# Patient Record
Sex: Female | Born: 1995 | Race: White | Hispanic: No | Marital: Married | Smoking: Never smoker
Health system: Southern US, Community
[De-identification: ages and names within clinical notes are randomized; demographics above are authoritative.]

## PROBLEM LIST (undated history)

## (undated) DIAGNOSIS — E282 Polycystic ovarian syndrome: Secondary | ICD-10-CM

## (undated) DIAGNOSIS — O24419 Gestational diabetes mellitus in pregnancy, unspecified control: Secondary | ICD-10-CM

## (undated) DIAGNOSIS — G43909 Migraine, unspecified, not intractable, without status migrainosus: Secondary | ICD-10-CM

## (undated) HISTORY — DX: Polycystic ovarian syndrome: E28.2

## (undated) HISTORY — PX: WISDOM TOOTH EXTRACTION: SHX21

## (undated) HISTORY — PX: CHOLECYSTECTOMY, LAPAROSCOPIC: SHX56

---
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
## 2016-01-02 ENCOUNTER — Encounter: Payer: Self-pay | Admitting: *Deleted

## 2016-01-02 LAB — TB SKIN TEST
Induration: 0 mm
TB Skin Test: NEGATIVE

## 2016-02-11 ENCOUNTER — Ambulatory Visit (INDEPENDENT_AMBULATORY_CARE_PROVIDER_SITE_OTHER): Payer: BLUE CROSS/BLUE SHIELD | Admitting: *Deleted

## 2016-02-11 DIAGNOSIS — Z111 Encounter for screening for respiratory tuberculosis: Secondary | ICD-10-CM

## 2016-02-11 DIAGNOSIS — Z23 Encounter for immunization: Secondary | ICD-10-CM | POA: Diagnosis not present

## 2016-02-13 LAB — TB SKIN TEST
Induration: 0 mm
TB Skin Test: NEGATIVE

## 2016-03-05 IMAGING — CR DG CHEST 2V
2 series · 2 of 2 positions shown · non-contrast
Comparison: None.

CLINICAL DATA: Chest and back pain radiating into left upper
extremity. Pleuritic.

EXAM:
CHEST  2 VIEW

[chest pa]
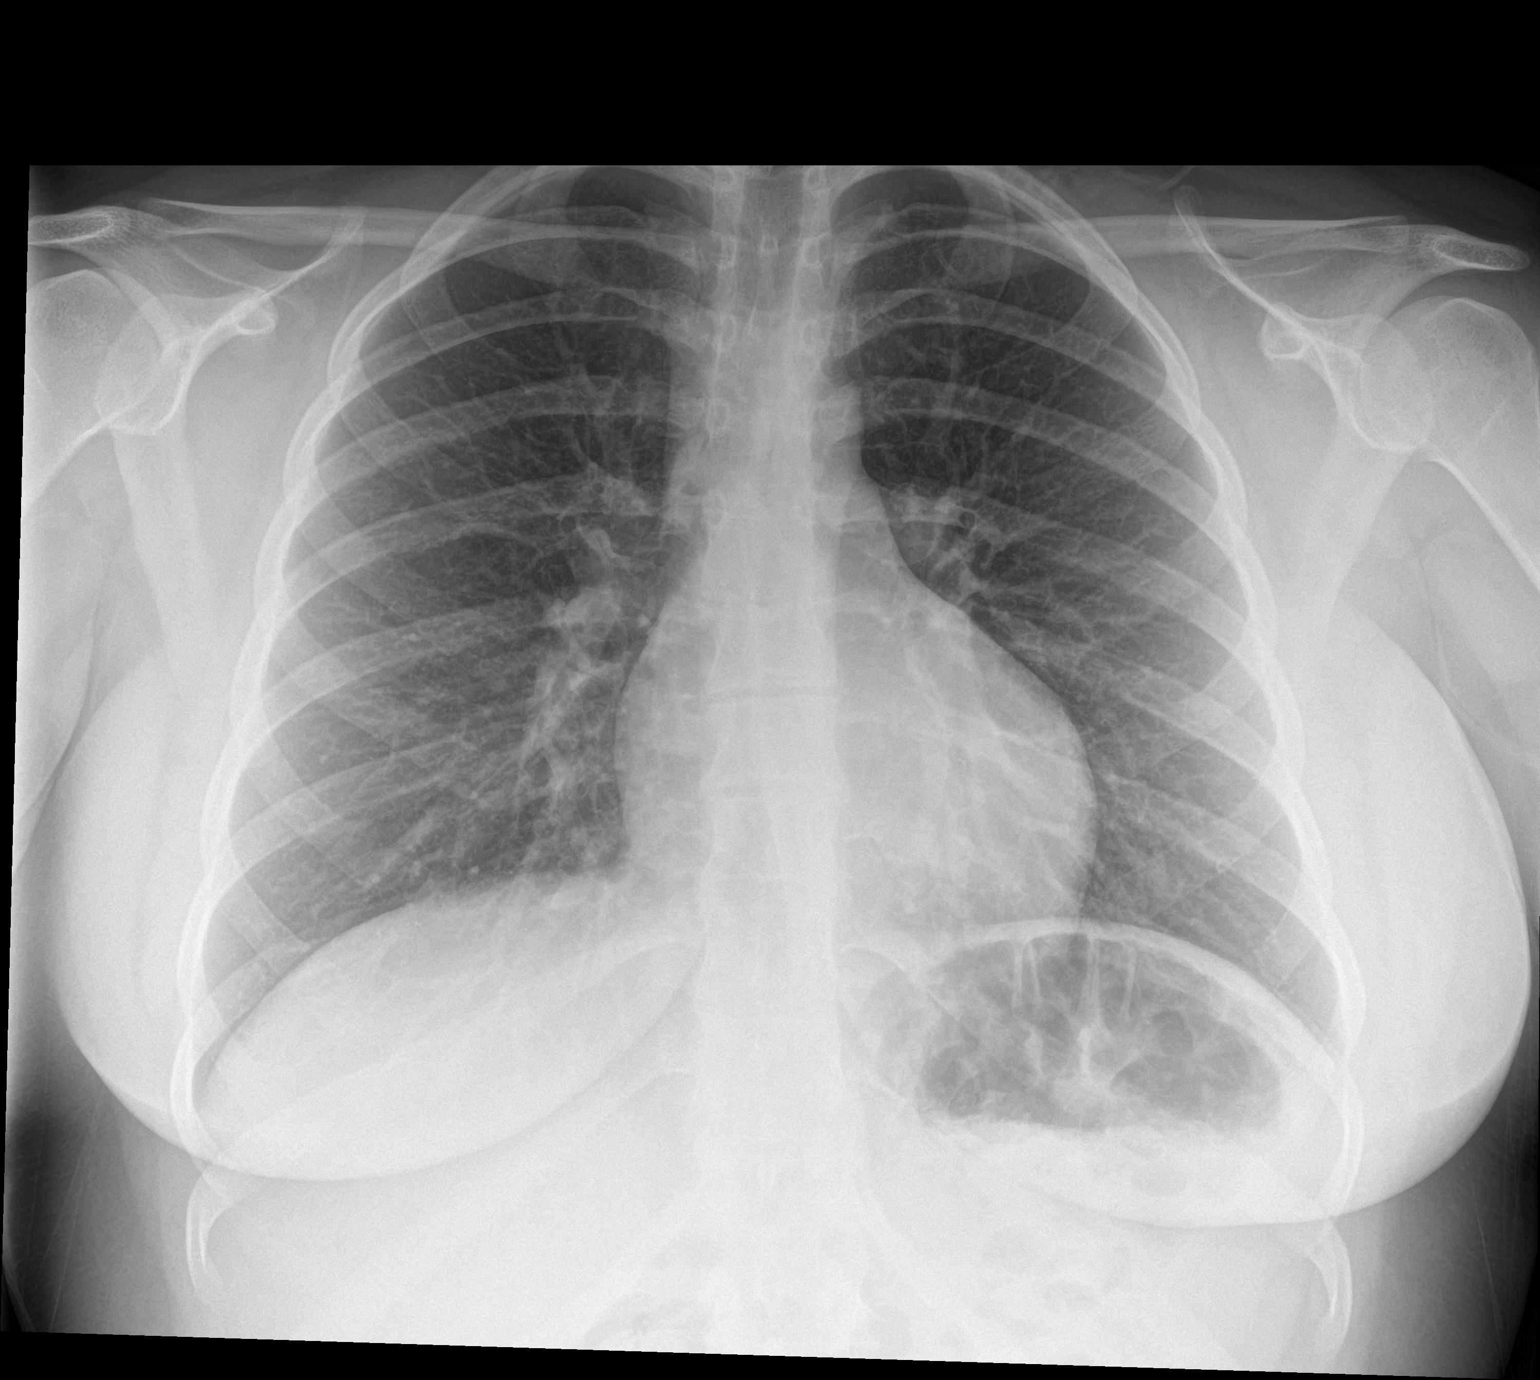

[chest lat]
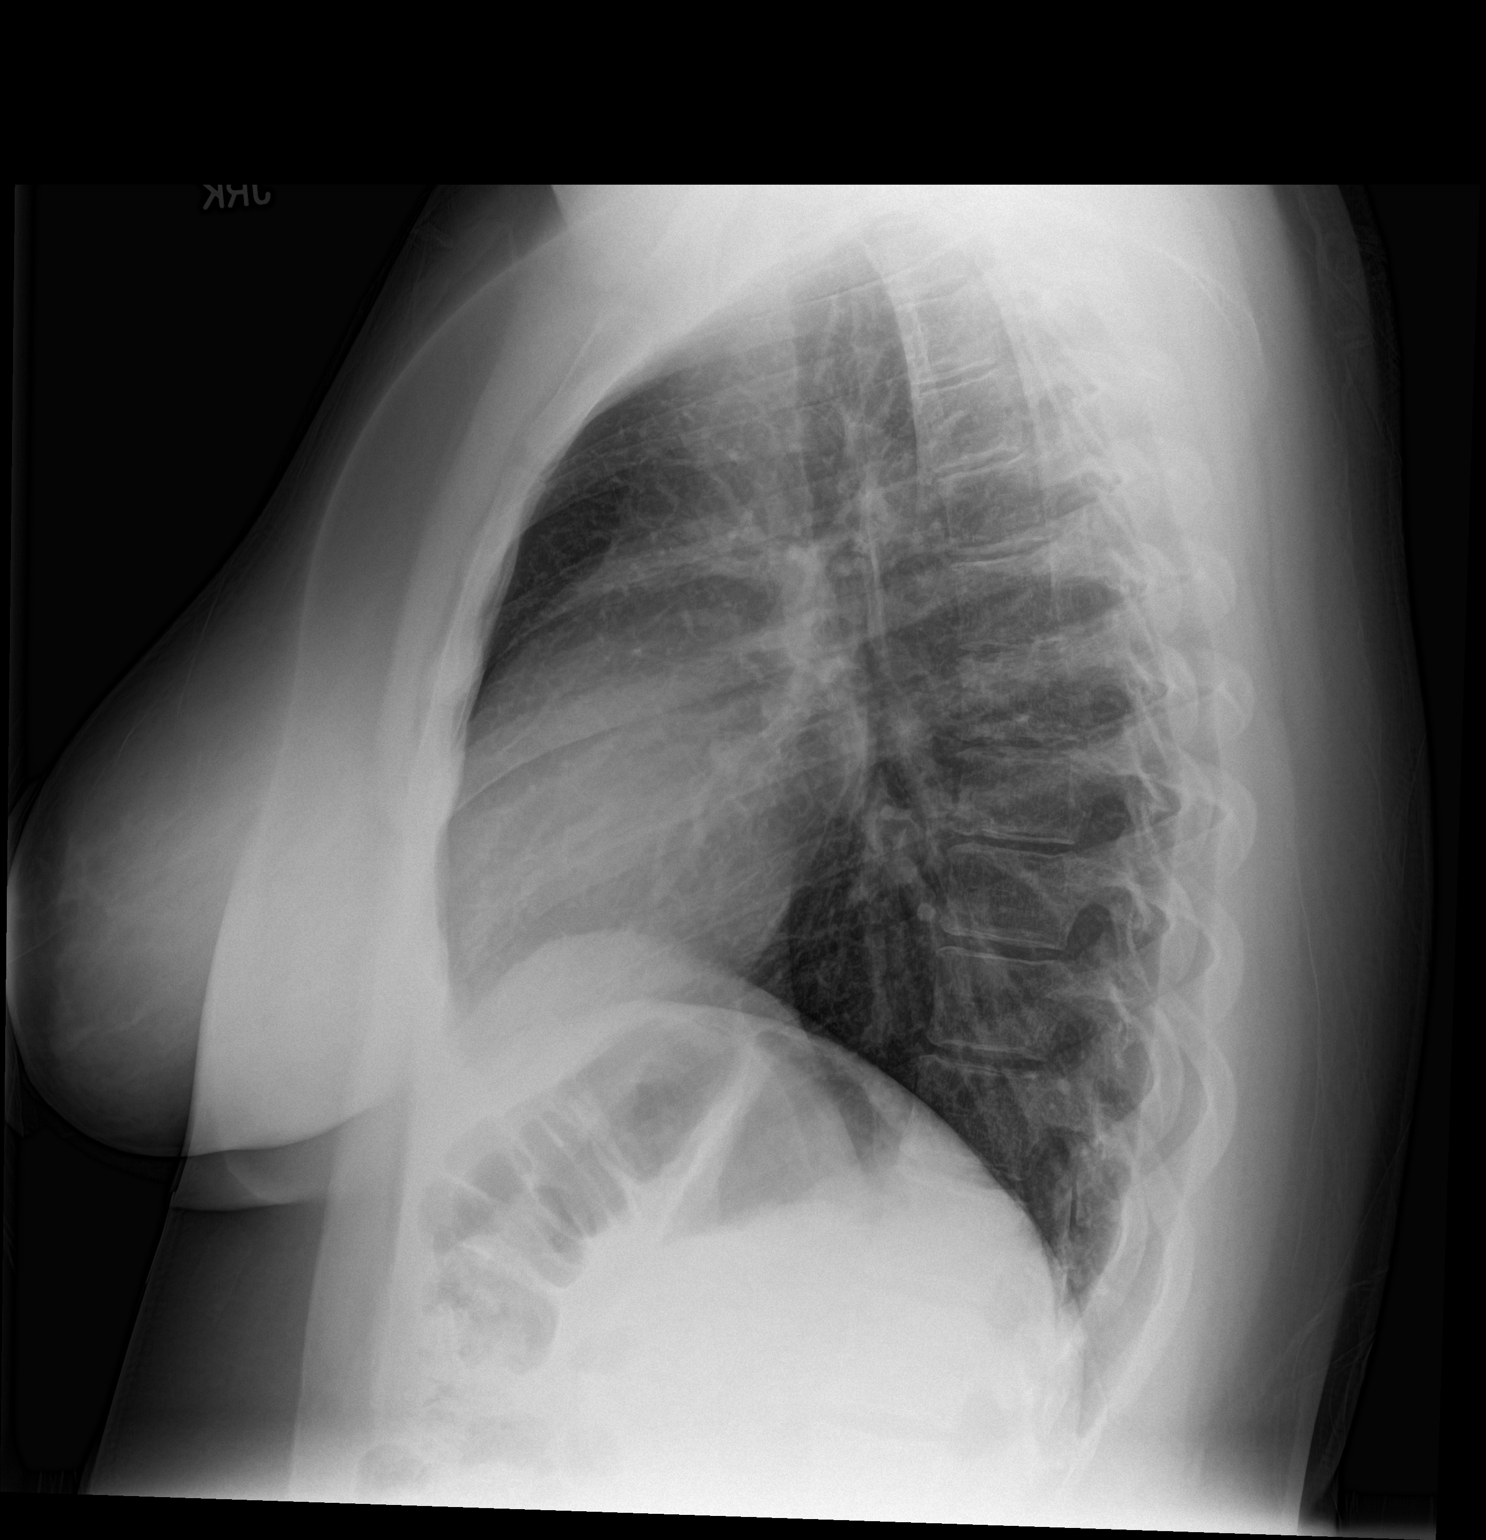

[2 of 2 positions shown; findings below may reference images not displayed]

FINDINGS: The heart size and mediastinal contours are within normal limits.
Both lungs are clear. The visualized skeletal structures are
unremarkable.
IMPRESSION: No active cardiopulmonary disease.

## 2016-03-23 ENCOUNTER — Telehealth: Payer: Self-pay | Admitting: Primary Care

## 2016-03-23 NOTE — Telephone Encounter (Signed)
[facility] - Client TELEPHONE ADVICE RECORD [facility]  Patient Name: Shelly Carpenter  DOB: 02-13-1996    Initial Comment Caller states c/o extreme lower back pain, right side flank pain, difficulty walking.   Nurse Assessment  Nurse: Dorthula RuePatten, RN, Enrique SackKendra Date/Time (Eastern Time): 03/23/2016 9:51:34 AM  Confirm and document reason for call. If symptomatic, describe symptoms. You must click the next button to save text entered. ---Caller does not have a history of back pain. She is having back pain and flank pain on the right side. Denies any urinary symptoms. Denies any fever. Caller states her pain started at 3am and woke her up.  Has the patient traveled out of the country within the last 30 days? ---Not Applicable  Does the patient have any new or worsening symptoms? ---Yes  Will a triage be completed? ---Yes  Related visit to physician within the last 2 weeks? ---No  Does the PT have any chronic conditions? (i.e. diabetes, asthma, etc.) ---Yes  List chronic conditions. ---PCOS  Is the patient pregnant or possibly pregnant? (Ask all females between the ages of 1212-55) ---No  Is this a behavioral health or substance abuse call? ---No     Guidelines    Guideline Title Affirmed Question Affirmed Notes  Flank Pain [1] SEVERE pain (e.g., excruciating, scale 8-10) AND [2] present > 1 hour    Final Disposition User   Go to ED Now Dorthula RuePatten, RN, Enrique SackKendra    Referrals  [facility] - ED   Disagree/Comply: Comply

## 2016-03-23 NOTE — Telephone Encounter (Signed)
Per DPR, left detail message left for patient to return my call. 

## 2016-03-23 NOTE — Telephone Encounter (Signed)
Please call and check on patient.  Is she still experiencing her symptoms? If so, then what? Please schedule her for an office visit this week if symptoms are still persistent.

## 2016-06-08 ENCOUNTER — Other Ambulatory Visit: Payer: Self-pay | Admitting: Gastroenterology

## 2016-06-08 DIAGNOSIS — R1011 Right upper quadrant pain: Secondary | ICD-10-CM

## 2016-06-15 ENCOUNTER — Ambulatory Visit
Admission: RE | Admit: 2016-06-15 | Discharge: 2016-06-15 | Disposition: A | Discharge status: Home / Self Care | Payer: BLUE CROSS/BLUE SHIELD | Source: Ambulatory Visit | Attending: Gastroenterology | Admitting: Gastroenterology

## 2016-06-15 DIAGNOSIS — R1011 Right upper quadrant pain: Secondary | ICD-10-CM

## 2016-06-18 ENCOUNTER — Ambulatory Visit (INDEPENDENT_AMBULATORY_CARE_PROVIDER_SITE_OTHER): Payer: BLUE CROSS/BLUE SHIELD | Admitting: Family Medicine

## 2016-06-18 ENCOUNTER — Encounter: Payer: Self-pay | Admitting: Family Medicine

## 2016-06-18 VITALS — BP 100/60 | HR 77 | Temp 98.0°F | Ht 63.0 in | Wt 223.2 lb

## 2016-06-18 DIAGNOSIS — N390 Urinary tract infection, site not specified: Secondary | ICD-10-CM | POA: Insufficient documentation

## 2016-06-18 DIAGNOSIS — N3001 Acute cystitis with hematuria: Secondary | ICD-10-CM

## 2016-06-18 DIAGNOSIS — R300 Dysuria: Secondary | ICD-10-CM

## 2016-06-18 LAB — POC URINALSYSI DIPSTICK (AUTOMATED)
Blood, UA: NEGATIVE
Glucose, UA: NEGATIVE
Ketones, UA: NEGATIVE
Nitrite, UA: POSITIVE
Spec Grav, UA: >=1.03
Urobilinogen, UA: 1
pH, UA: 6

## 2016-06-18 LAB — POCT UA - MICROSCOPIC ONLY: Yeast, UA: NEGATIVE

## 2016-06-18 MED ORDER — SULFAMETHOXAZOLE-TRIMETHOPRIM 800-160 MG PO TABS: 1.0000 | ORAL_TABLET | Freq: Two times a day (BID) | ORAL | 0 refills | Status: DC

## 2016-06-18 NOTE — Addendum Note (Signed)
Addended by: Damita LackLORING, DONNA S on: 06/18/2016 04:48 PM   Modules accepted: Orders

## 2016-06-18 NOTE — Patient Instructions (Signed)
Push fluids, water intake.  We will start 3 day course of antibiotics.  We will call with urine culture.  If symptoms not improving at end of course, call.   Urinary Tract Infection, Adult A urinary tract infection (UTI) is an infection of any part of the urinary tract, which includes the kidneys, ureters, bladder, and urethra. These organs make, store, and get rid of urine in the body. UTI can be a bladder infection (cystitis) or kidney infection (pyelonephritis). What are the causes? This infection may be caused by fungi, viruses, or bacteria. Bacteria are the most common cause of UTIs. This condition can also be caused by repeated incomplete emptying of the bladder during urination. What increases the risk? This condition is more likely to develop if:  You ignore your need to urinate or hold urine for long periods of time.  You do not empty your bladder completely during urination.  You wipe back to front after urinating or having a bowel movement, if you are female.  You are uncircumcised, if you are female.  You are constipated.  You have a urinary catheter that stays in place (indwelling).  You have a weak defense (immune) system.  You have a medical condition that affects your bowels, kidneys, or bladder.  You have diabetes.  You take antibiotic medicines frequently or for long periods of time, and the antibiotics no longer work well against certain types of infections (antibiotic resistance).  You take medicines that irritate your urinary tract.  You are exposed to chemicals that irritate your urinary tract.  You are female. What are the signs or symptoms? Symptoms of this condition include:  Fever.  Frequent urination or passing small amounts of urine frequently.  Needing to urinate urgently.  Pain or burning with urination.  Urine that smells bad or unusual.  Cloudy urine.  Pain in the lower abdomen or back.  Trouble urinating.  Blood in the  urine.  Vomiting or being less hungry than normal.  Diarrhea or abdominal pain.  Vaginal discharge, if you are female. How is this diagnosed? This condition is diagnosed with a medical history and physical exam. You will also need to provide a urine sample to test your urine. Other tests may be done, including:  Blood tests.  Sexually transmitted disease (STD) testing. If you have had more than one UTI, a cystoscopy or imaging studies may be done to determine the cause of the infections. How is this treated? Treatment for this condition often includes a combination of two or more of the following:  Antibiotic medicine.  Other medicines to treat less common causes of UTI.  Over-the-counter medicines to treat pain.  Drinking enough water to stay hydrated. Follow these instructions at home:  Take over-the-counter and prescription medicines only as told by your health care provider.  If you were prescribed an antibiotic, take it as told by your health care provider. Do not stop taking the antibiotic even if you start to feel better.  Avoid alcohol, caffeine, tea, and carbonated beverages. They can irritate your bladder.  Drink enough fluid to keep your urine clear or pale yellow.  Keep all follow-up visits as told by your health care provider. This is important.  Make sure to:  Empty your bladder often and completely. Do not hold urine for long periods of time.  Empty your bladder before and after sex.  Wipe from front to back after a bowel movement if you are female. Use each tissue one time when you  wipe. Contact a health care provider if:  You have back pain.  You have a fever.  You feel nauseous or vomit.  Your symptoms do not get better after 3 days.  Your symptoms go away and then return. Get help right away if:  You have severe back pain or lower abdominal pain.  You are vomiting and cannot keep down any medicines or water. This information is not  intended to replace advice given to you by your health care provider. Make sure you discuss any questions you have with your health care provider. Document Released: 01/28/2005 Document Revised: 10/02/2015 Document Reviewed: 03/11/2015 Elsevier Interactive Patient Education  2017 [employer].

## 2016-06-18 NOTE — Progress Notes (Signed)
   Subjective:    Patient ID: Shelly Carpenter, female    DOB: [birth year], 21 y.o.   MRN: [id number]  Dysuria   This is a new problem. The current episode started yesterday. The problem has been gradually improving. The quality of the pain is described as stabbing. The pain is at a severity of 6/10. The pain is moderate. There has been no fever. She is sexually active. There is no history of pyelonephritis. Associated symptoms include frequency, hematuria, nausea and urgency. Pertinent negatives include no chills, discharge or vomiting. She has tried increased fluids ( cranberry juice, azo) for the symptoms. The treatment provided mild relief. There is no history of catheterization, kidney stones, recurrent UTIs, a single kidney, urinary stasis or a urological procedure.  Hematuria  Irritative symptoms include frequency and urgency. Associated symptoms include dysuria and nausea. Pertinent negatives include no chills or vomiting. There is no history of kidney stones.      Review of Systems  Constitutional: Negative for chills.  Gastrointestinal: Positive for nausea. Negative for vomiting.  Genitourinary: Positive for dysuria, frequency, hematuria and urgency.   Blood pressure 100/60, pulse 77, temperature 98 F (36.7 C), temperature source Oral, height 5\' 3"  (1.6 m), weight 223 lb 4 oz (101.3 kg).     Objective:   Physical Exam  Constitutional: Vital signs are normal. She appears well-developed and well-nourished. She is cooperative.  Non-toxic appearance. She does not appear ill. No distress.  HENT:  Head: Normocephalic.  Right Ear: Hearing, tympanic membrane, external ear and ear canal normal. Tympanic membrane is not erythematous, not retracted and not bulging.  Left Ear: Hearing, tympanic membrane, external ear and ear canal normal. Tympanic membrane is not erythematous, not retracted and not bulging.  Nose: No mucosal edema or rhinorrhea. Right sinus exhibits no maxillary sinus  tenderness and no frontal sinus tenderness. Left sinus exhibits no maxillary sinus tenderness and no frontal sinus tenderness.  Mouth/Throat: Uvula is midline, oropharynx is clear and moist and mucous membranes are normal.  Eyes: Conjunctivae, EOM and lids are normal. Pupils are equal, round, and reactive to light. Lids are everted and swept, no foreign bodies found.  Neck: Trachea normal and normal range of motion. Neck supple. Carotid bruit is not present. No thyroid mass and no thyromegaly present.  Cardiovascular: Normal rate, regular rhythm, S1 normal, S2 normal, intact distal pulses and normal pulses.  Exam reveals no gallop and no friction rub.   Murmur heard.  Systolic murmur is present with a grade of 1/6   benign sounding murmur in LUSB, likely flow murmur.  Pulmonary/Chest: Effort normal and breath sounds normal. No tachypnea. No respiratory distress. She has no decreased breath sounds. She has no wheezes. She has no rhonchi. She has no rales.  Abdominal: Soft. Normal appearance and bowel sounds are normal. There is tenderness in the suprapubic area. There is no CVA tenderness.  Neurological: She is alert.  Skin: Skin is warm, dry and intact. No rash noted.  Psychiatric: Her speech is normal and behavior is normal. Judgment and thought content normal. Her mood appears not anxious. Cognition and memory are normal. She does not exhibit a depressed mood.          Assessment & Plan:

## 2016-06-18 NOTE — Assessment & Plan Note (Signed)
No hematuria on UA today.  Uncomplicated. Treat with 3 days of antibiotics and fluids.

## 2016-06-18 NOTE — Progress Notes (Signed)
Pre visit review using our clinic review tool, if applicable. No additional management support is needed unless otherwise documented below in the visit note. 

## 2016-06-19 ENCOUNTER — Other Ambulatory Visit: Payer: BLUE CROSS/BLUE SHIELD

## 2016-06-23 ENCOUNTER — Telehealth: Payer: Self-pay | Admitting: Primary Care

## 2016-06-23 LAB — URINE CULTURE

## 2016-06-23 NOTE — Telephone Encounter (Signed)
Pt returned your call best number to call  [phone]

## 2016-06-23 NOTE — Telephone Encounter (Signed)
Urine culture results discussed with patient.  See result note on 06/18/2016 urine culture.

## 2016-12-09 ENCOUNTER — Ambulatory Visit: Payer: BLUE CROSS/BLUE SHIELD

## 2016-12-15 ENCOUNTER — Ambulatory Visit: Payer: BLUE CROSS/BLUE SHIELD

## 2017-05-28 IMAGING — US US ABDOMEN LIMITED
1 series · 14 of 25 positions shown · non-contrast
Comparison: None.

CLINICAL DATA: Right upper quadrant pain .

EXAM:
US ABDOMEN LIMITED - RIGHT UPPER QUADRANT

[Series 1: us abdomen limited · 0.25mm/px · 14 of 44 slices shown]
[im 1/44]
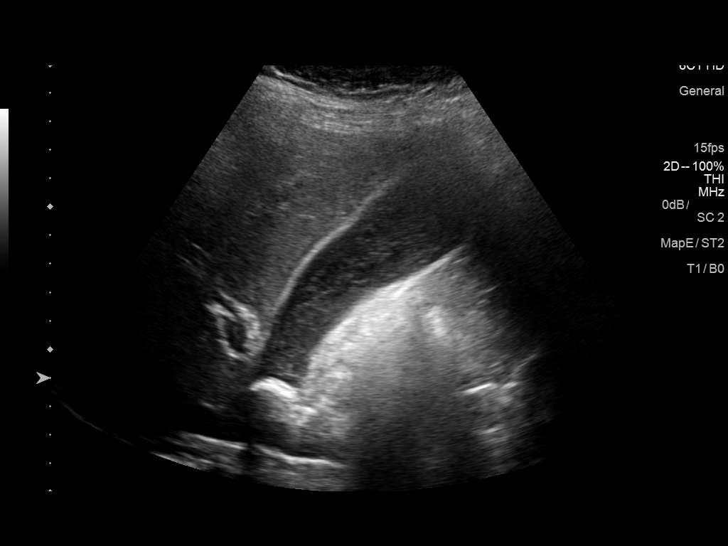
[im 4/44]
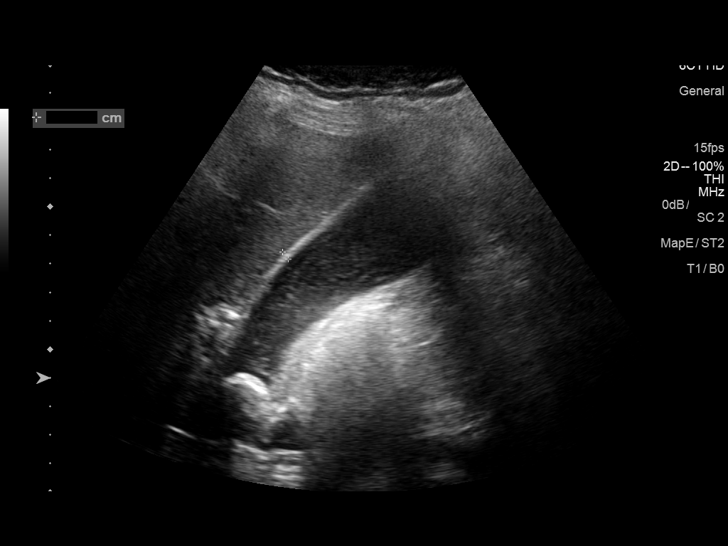
[im 8/44]
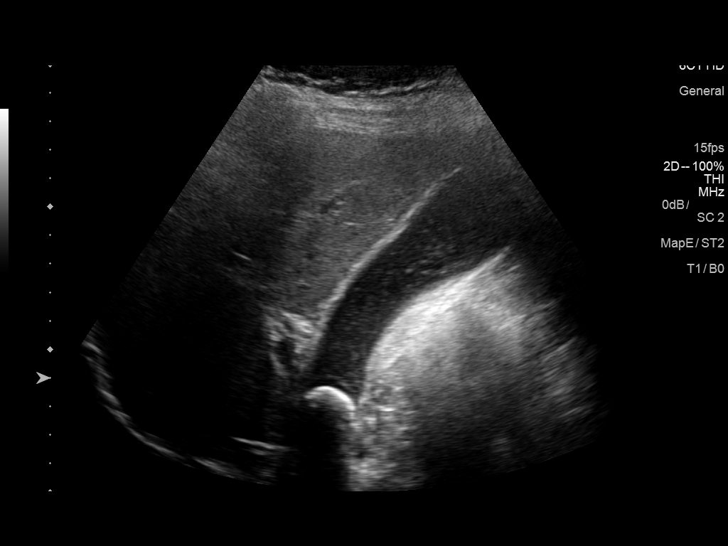
[im 11/44]
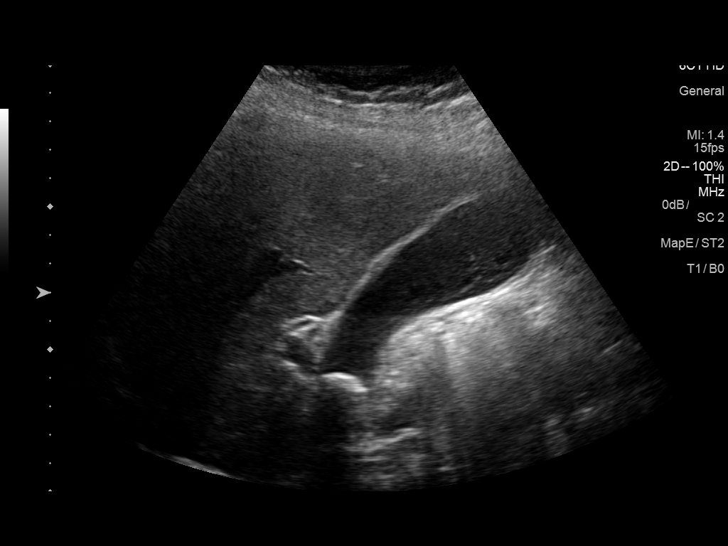
[im 15/44]
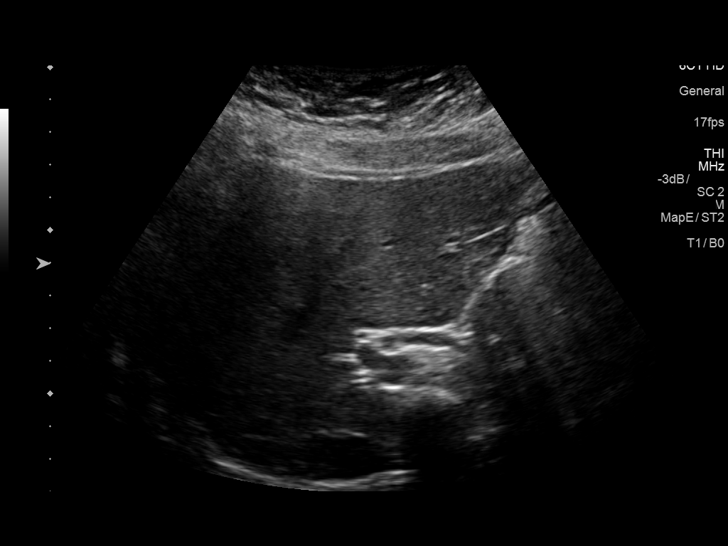
[im 17/44]
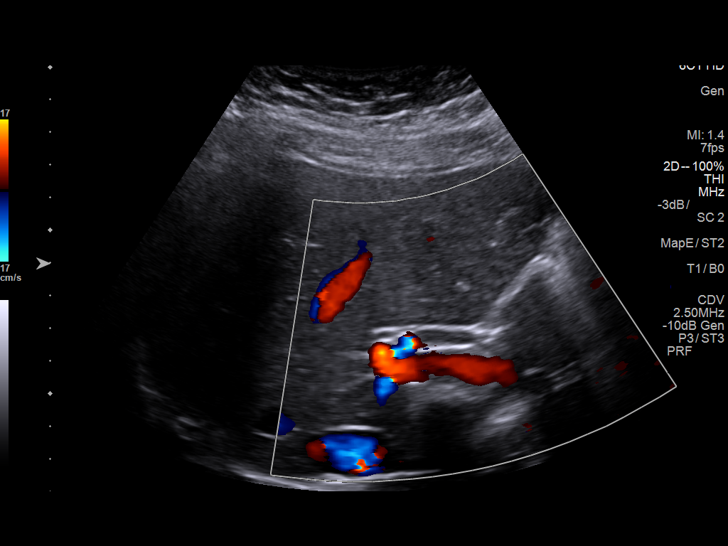
[im 20/44]
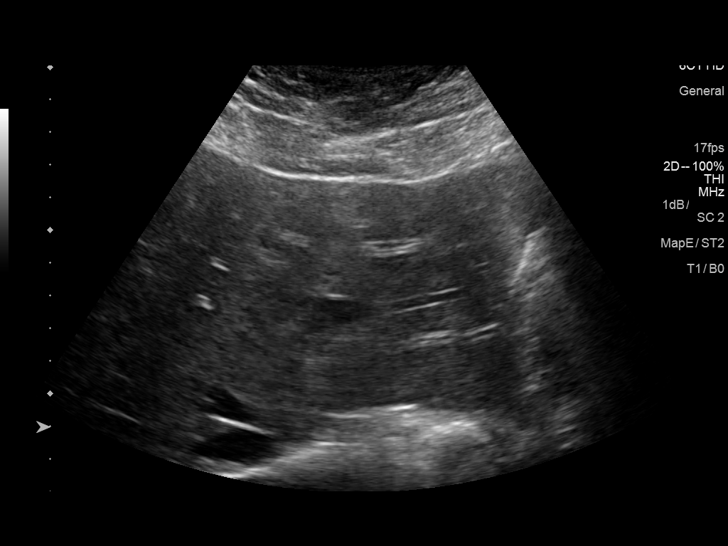
[im 24/44]
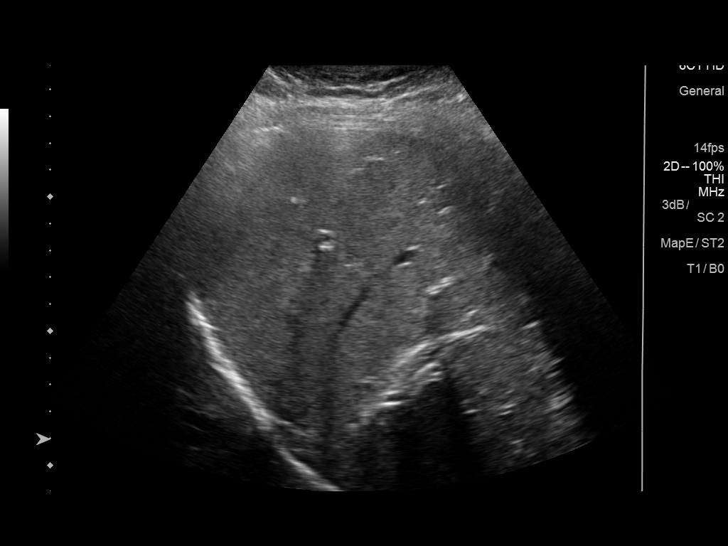
[im 27/44]
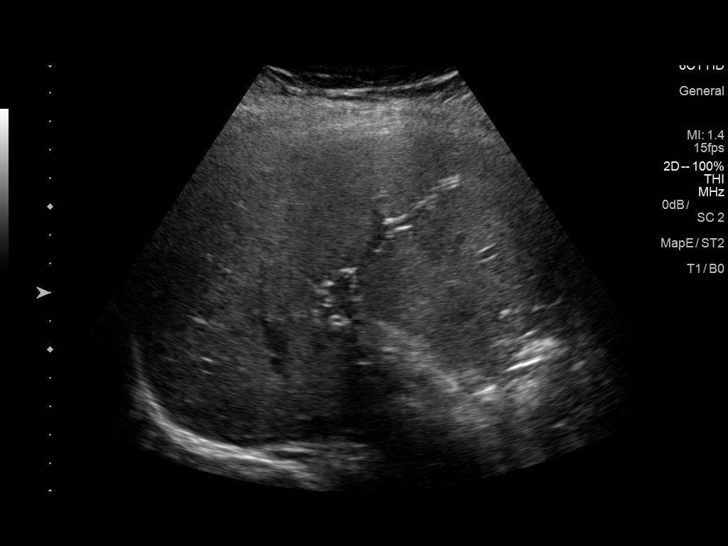
[im 29/44]
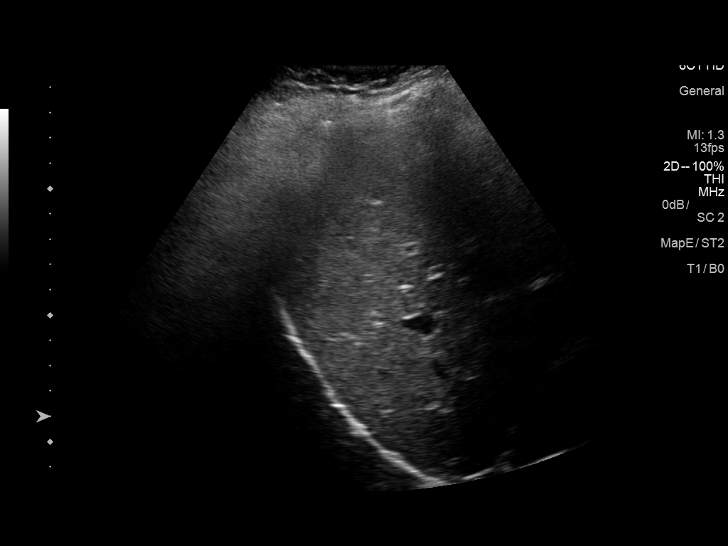
[im 33/44]
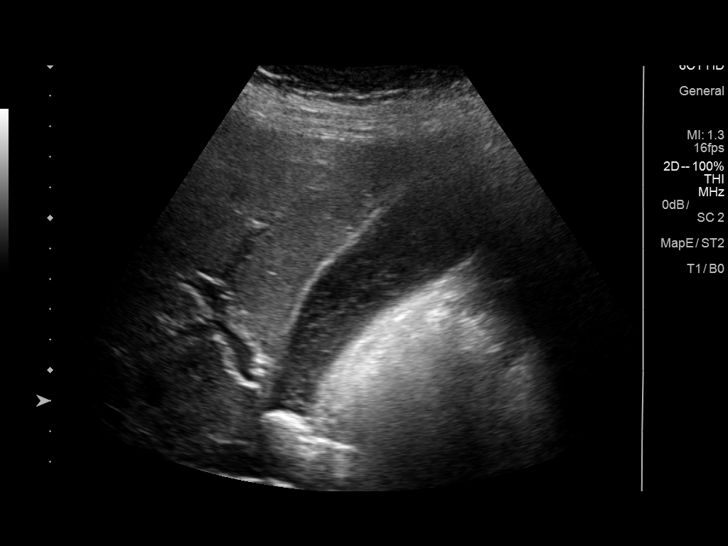
[im 36/44]
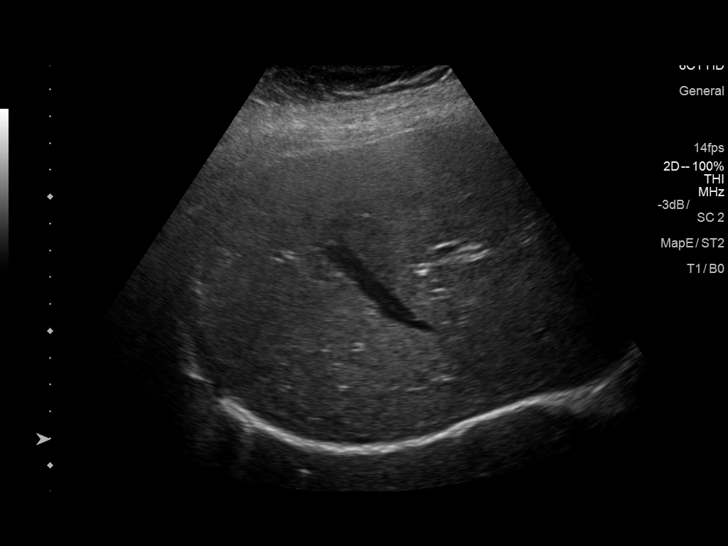
[im 40/44]
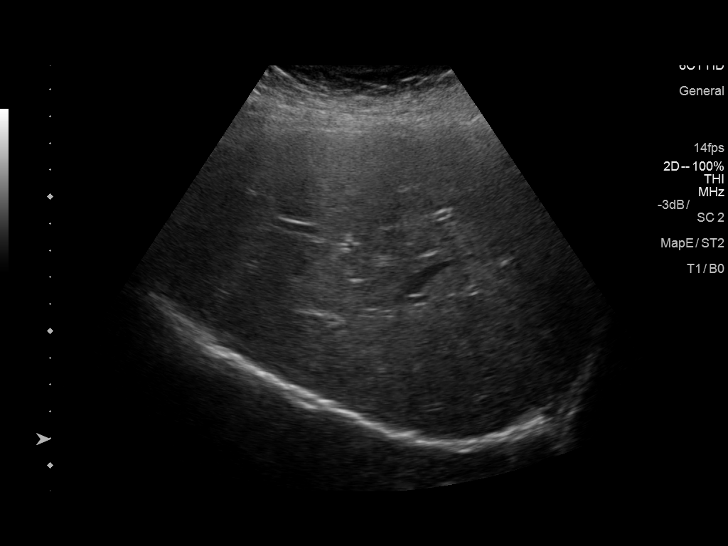
[im 44/44]
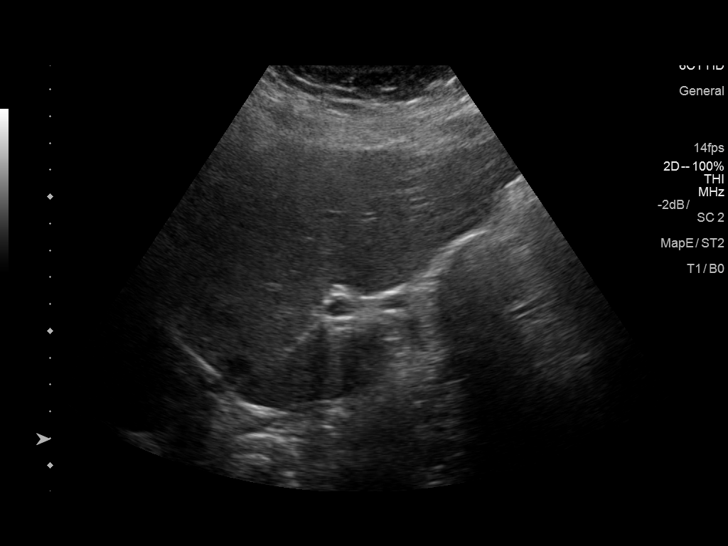

[14 of 25 positions shown; findings below may reference images not displayed]

FINDINGS: Gallbladder:

Non mobile shadowing echo densities noted in the neck of the
gallbladder measuring 2.2 cm. Is most likely a non mobile stone.
Sludge is noted gallbladder. Gallbladder wall thickness 3.2 mm.

Common bile duct:

Diameter: 3.9 mm

Liver:

No focal lesion identified. Within normal limits in parenchymal
echogenicity.
IMPRESSION: 1. Prominent gallstone in the neck of the gallbladder measuring
cm. The gallstone was non mobile. Sludge in the gall bladder.
Minimal gallbladder wall thickening at 3.2 mm. Cholecystitis cannot
be completely excluded. Negative Murphy sign. No biliary distention.

2. Liver is unremarkable.

## 2017-09-02 ENCOUNTER — Other Ambulatory Visit: Payer: Self-pay | Admitting: Obstetrics and Gynecology

## 2017-09-02 DIAGNOSIS — N644 Mastodynia: Secondary | ICD-10-CM

## 2017-09-13 ENCOUNTER — Ambulatory Visit
Admission: RE | Admit: 2017-09-13 | Discharge: 2017-09-13 | Disposition: A | Discharge status: Home / Self Care | Payer: BLUE CROSS/BLUE SHIELD | Source: Ambulatory Visit | Attending: Obstetrics and Gynecology | Admitting: Obstetrics and Gynecology

## 2017-09-13 DIAGNOSIS — N644 Mastodynia: Secondary | ICD-10-CM

## 2020-02-27 ENCOUNTER — Other Ambulatory Visit: Payer: Self-pay

## 2020-02-27 ENCOUNTER — Ambulatory Visit
Admission: EM | Admit: 2020-02-27 | Discharge: 2020-02-27 | Disposition: A | Discharge status: Home / Self Care | Payer: BLUE CROSS/BLUE SHIELD | Attending: Emergency Medicine | Admitting: Emergency Medicine

## 2020-02-27 ENCOUNTER — Encounter: Payer: Self-pay | Admitting: Emergency Medicine

## 2020-02-27 DIAGNOSIS — J069 Acute upper respiratory infection, unspecified: Secondary | ICD-10-CM | POA: Diagnosis not present

## 2020-02-27 MED ORDER — AMOXICILLIN-POT CLAVULANATE 875-125 MG PO TABS: 1.0000 | ORAL_TABLET | Freq: Two times a day (BID) | ORAL | 0 refills | Status: AC

## 2020-02-27 MED ORDER — FLUTICASONE PROPIONATE 50 MCG/ACT NA SUSP: 1.0000 | Freq: Every day | NASAL | 0 refills | Status: DC

## 2020-02-27 MED ORDER — BENZONATATE 200 MG PO CAPS: 200.0000 mg | ORAL_CAPSULE | Freq: Three times a day (TID) | ORAL | 0 refills | Status: AC | PRN

## 2020-02-27 NOTE — Discharge Instructions (Signed)
Continue to rest and drink plenty of fluids Tessalon/benzonatate every 8 hours for cough Flonase nasal spray 1 to 2 spray in each nostril daily to help with congestion May continue Mucinex May try daily cetirizine/Zyrtec to further help with congestion and drainage Ibuprofen and Tylenol as needed for headaches If not having any improvement with the above/an additional 3 to 4 days may fill prescription for Augmentin on Friday to cover sinus infection Follow-up if not improving or worsening, developing shortness of breath, wheezing or difficulty breathing

## 2020-02-27 NOTE — ED Triage Notes (Signed)
Cough, runny nose, congestion x Friday.  Pt tested by pcr for covid and was neg.

## 2020-02-27 NOTE — ED Provider Notes (Signed)
[facility]    CSN: [id number] Arrival date & time: 02/27/20  0919      History   Chief Complaint Chief Complaint  Patient presents with  . Cough    HPI Shelly Carpenter is a 24 y.o. female presenting today for evaluation of URI symptoms.  Patient reports 5 days of cough congestion and intermittent sore throat.  Using Mucinex without relief.  Reports she is concerned about her symptoms turning into bronchitis.  Symptoms are mainly in her sinuses at this point.  Denies any chest pain or shortness of breath.  Has had prior Covid testing which was negative.   HPI  Past Medical History:  Diagnosis Date  . PCOS (polycystic ovarian syndrome)   . Polycystic disease, ovaries     Patient Active Problem List   Diagnosis Date Noted  . UTI (urinary tract infection) 06/18/2016  . Preventative health care 12/27/2015  . Frequent headaches 12/27/2015  . PCOS (polycystic ovarian syndrome) 12/27/2015    Past Surgical History:  Procedure Laterality Date  . WISDOM TOOTH EXTRACTION      OB History   No obstetric history on file.      Home Medications    Prior to Admission medications   Medication Sig Start Date End Date Taking? Authorizing Provider  amoxicillin-clavulanate (AUGMENTIN) 875-125 MG tablet Take 1 tablet by mouth every 12 (twelve) hours for 7 days. 03/01/20 03/08/20  Acxel Dingee C, PA-C  benzonatate (TESSALON) 200 MG capsule Take 1 capsule (200 mg total) by mouth 3 (three) times daily as needed for up to 7 days for cough. 02/27/20 03/05/20  Ezra Denne C, PA-C  fluticasone (FLONASE) 50 MCG/ACT nasal spray Place 1-2 sprays into both nostrils daily. 02/27/20   Adilene Areola C, PA-C  metFORMIN (GLUCOPHAGE) 500 MG tablet Take 500 mg by mouth daily with breakfast.     [provider]  norelgestromin-ethinyl estradiol Burr Medico) 150-35 MCG/24HR transdermal patch Place 1 patch onto the skin once a week.    [provider]    sulfamethoxazole-trimethoprim (BACTRIM DS,SEPTRA DS) 800-160 MG tablet Take 1 tablet by mouth 2 (two) times daily. 06/18/16   Excell Seltzer, MD    Family History No family history on file.  Social History Social History   Tobacco Use  . Smoking status: Never Smoker  . Smokeless tobacco: Never Used  Substance Use Topics  . Alcohol use: No  . Drug use: Not on file     Allergies   Patient has no known allergies.   Review of Systems Review of Systems  Constitutional: Negative for activity change, appetite change, chills, fatigue and fever.  HENT: Positive for congestion, rhinorrhea, sinus pressure and sore throat. Negative for ear pain and trouble swallowing.   Eyes: Negative for discharge and redness.  Respiratory: Positive for cough. Negative for chest tightness and shortness of breath.   Cardiovascular: Negative for chest pain.  Gastrointestinal: Positive for nausea. Negative for abdominal pain, diarrhea and vomiting.  Musculoskeletal: Negative for myalgias.  Skin: Negative for rash.  Neurological: Negative for dizziness, light-headedness and headaches.     Physical Exam Triage Vital Signs ED Triage Vitals  Enc Vitals Group     BP 02/27/20 0933 138/79     Pulse Rate 02/27/20 0933 86     Resp 02/27/20 0933 19     Temp 02/27/20 0933 98.2 F (36.8 C)     Temp Source 02/27/20 0933 Oral     SpO2 02/27/20 0933 97 %  Weight 02/27/20 0934 237 lb (107.5 kg)     Height 02/27/20 0934 5\' 3"  (1.6 m)     Head Circumference --      Peak Flow --      Pain Score 02/27/20 0934 0     Pain Loc --      Pain Edu? --      Excl. in GC? --    No data found.  Updated Vital Signs BP 138/79 (BP Location: Right Arm)   Pulse 86   Temp 98.2 F (36.8 C) (Oral)   Resp 19   Ht 5\' 3"  (1.6 m)   Wt 237 lb (107.5 kg)   LMP 01/30/2020   SpO2 97%   BMI 41.98 kg/m   Visual Acuity Right Eye Distance:   Left Eye Distance:   Bilateral Distance:    Right Eye Near:   Left Eye Near:     Bilateral Near:     Physical Exam Vitals and nursing note reviewed.  Constitutional:      Appearance: She is well-developed.     Comments: No acute distress  HENT:     Head: Normocephalic and atraumatic.     Ears:     Comments: Bilateral ears without tenderness to palpation of external auricle, tragus and mastoid, EAC's without erythema or swelling, TM's with good bony landmarks and cone of light. Non erythematous.     Nose: Nose normal.     Mouth/Throat:     Comments: Oral mucosa pink and moist, no tonsillar enlargement or exudate. Posterior pharynx patent and nonerythematous, no uvula deviation or swelling. Normal phonation. Eyes:     Conjunctiva/sclera: Conjunctivae normal.  Cardiovascular:     Rate and Rhythm: Normal rate.  Pulmonary:     Effort: Pulmonary effort is normal. No respiratory distress.     Comments: Breathing comfortably at rest, CTABL, no wheezing, rales or other adventitious sounds auscultated Abdominal:     General: There is no distension.  Musculoskeletal:        General: Normal range of motion.     Cervical back: Neck supple.  Skin:    General: Skin is warm and dry.  Neurological:     Mental Status: She is alert and oriented to person, place, and time.      UC Treatments / Results  Labs (all labs ordered are listed, but only abnormal results are displayed) Labs Reviewed - No data to display  EKG   Radiology No results found.  Procedures Procedures (including critical care time)  Medications Ordered in UC Medications - No data to display  Initial Impression / Assessment and Plan / UC Course  I have reviewed the triage vital signs and the nursing notes.  Pertinent labs & imaging results that were available during my care of the patient were reviewed by me and considered in my medical decision making (see chart for details).     5 days of URI symptoms, lungs clear, exam reassuring, at this time still suspect likely viral URI and  recommending continued symptomatic and supportive care rest and fluids.  Discussed medicines to add in to further help relieve symptoms temporarily.  Provided prescription for Augmentin to fill on Friday if still not having any improvement with recommendations today to cover sinusitis.  No signs of bronchitis or pneumonia at this time.  Discussed strict return precautions. Patient verbalized understanding and is agreeable with plan.  Final Clinical Impressions(s) / UC Diagnoses   Final diagnoses:  Viral URI with cough  Discharge Instructions     Continue to rest and drink plenty of fluids Tessalon/benzonatate every 8 hours for cough Flonase nasal spray 1 to 2 spray in each nostril daily to help with congestion May continue Mucinex May try daily cetirizine/Zyrtec to further help with congestion and drainage Ibuprofen and Tylenol as needed for headaches If not having any improvement with the above/an additional 3 to 4 days may fill prescription for Augmentin on Friday to cover sinus infection Follow-up if not improving or worsening, developing shortness of breath, wheezing or difficulty breathing    ED Prescriptions    Medication Sig Dispense Auth. Provider   benzonatate (TESSALON) 200 MG capsule Take 1 capsule (200 mg total) by mouth 3 (three) times daily as needed for up to 7 days for cough. 28 capsule Chevi Lim C, PA-C   fluticasone (FLONASE) 50 MCG/ACT nasal spray Place 1-2 sprays into both nostrils daily. 16 g Ja Pistole C, PA-C   amoxicillin-clavulanate (AUGMENTIN) 875-125 MG tablet Take 1 tablet by mouth every 12 (twelve) hours for 7 days. 14 tablet Ailana Cuadrado, [place] C, PA-C     PDMP not reviewed this encounter.   Sylvan Sookdeo, [place] C, PA-C 02/27/20 1019

## 2021-05-06 ENCOUNTER — Other Ambulatory Visit: Payer: Self-pay | Admitting: Physician Assistant

## 2021-05-06 ENCOUNTER — Other Ambulatory Visit (HOSPITAL_COMMUNITY): Payer: Self-pay | Admitting: Physician Assistant

## 2021-05-06 DIAGNOSIS — G43009 Migraine without aura, not intractable, without status migrainosus: Secondary | ICD-10-CM

## 2021-05-06 DIAGNOSIS — H9319 Tinnitus, unspecified ear: Secondary | ICD-10-CM

## 2021-05-14 ENCOUNTER — Ambulatory Visit (HOSPITAL_COMMUNITY)
Admission: RE | Admit: 2021-05-14 | Discharge: 2021-05-14 | Disposition: A | Discharge status: Home / Self Care | Payer: BC Managed Care – PPO | Source: Ambulatory Visit | Attending: Physician Assistant | Admitting: Physician Assistant

## 2021-05-14 ENCOUNTER — Other Ambulatory Visit: Payer: Self-pay

## 2021-05-14 DIAGNOSIS — H9319 Tinnitus, unspecified ear: Secondary | ICD-10-CM | POA: Diagnosis present

## 2021-05-14 DIAGNOSIS — G43009 Migraine without aura, not intractable, without status migrainosus: Secondary | ICD-10-CM | POA: Insufficient documentation

## 2021-05-20 ENCOUNTER — Encounter: Payer: Self-pay | Admitting: Neurology

## 2021-05-20 ENCOUNTER — Ambulatory Visit (INDEPENDENT_AMBULATORY_CARE_PROVIDER_SITE_OTHER): Payer: BC Managed Care – PPO | Admitting: Neurology

## 2021-05-20 ENCOUNTER — Other Ambulatory Visit: Payer: Self-pay

## 2021-05-20 VITALS — BP 127/70 | HR 81 | Ht 63.0 in | Wt 257.0 lb

## 2021-05-20 DIAGNOSIS — G43709 Chronic migraine without aura, not intractable, without status migrainosus: Secondary | ICD-10-CM | POA: Diagnosis not present

## 2021-05-20 DIAGNOSIS — E348 Other specified endocrine disorders: Secondary | ICD-10-CM | POA: Insufficient documentation

## 2021-05-20 MED ORDER — TOPIRAMATE 100 MG PO TABS: 100.0000 mg | ORAL_TABLET | Freq: Two times a day (BID) | ORAL | 11 refills | Status: DC

## 2021-05-20 NOTE — Progress Notes (Signed)
error 

## 2021-05-20 NOTE — Progress Notes (Signed)
Chief Complaint  Patient presents with   New Patient (Initial Visit)    Rm 14. Accompanied by husband. 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. Shelly Carpenter MRI, complete lesion C4 level cervical spine - migraines and tinnitus. Pt reports daily head pain or headache. States pain is located in the top of the head, back of neck, or occipital region of head.      ASSESSMENT AND PLAN  Shelly Carpenter is a 26 y.o. 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   MEDICAL HISTORY:  Shelly Carpenter is a 26 year old female, seen in request by her primary care PA Launa Grill H, for evaluation of increased frequency of headaches, tinnitus, initial evaluation was on May 20, 2021 with her husband  I reviewed and summarized the referring note.  Past medical history Polycystic ovarian syndrome,  She reported a long history of mild headaches since elementary school, holoacranial, was never treated, her headache improved after college  Since 2022, she began to have frequent headaches again, especially since December 2022, she began to have headaches 3-4 times each week, right lateralized retro-orbital occipital region pain, sometimes extending to neck,  Couple times each month, she would have severe headache, pounding, light noise sensitivity, sometimes proceeding by visual disturbance, tinnitus, nausea during  intense headache, last for hours, even longer  She has tried over-the-counter Tylenol, ibuprofen, Excedrin Migraine, with limited help, was given Nurtec sample, was very helpful, headache resolved in 2 hours.  Chart reviewed, she does have 20 pounds weight gain over the past couple years, last ophthalmology visit was more than a year ago   PHYSICAL EXAM:   Vitals:   05/20/21 0846  BP: 127/70  Pulse: 81  Weight: 257 lb (116.6 kg)  Height: 5\' 3"  (1.6 m)   Not recorded     Body mass index is 45.53 kg/m.  PHYSICAL EXAMNIATION:  Gen: NAD, conversant, well nourised, well groomed                     Cardiovascular: Regular rate rhythm, no peripheral edema, warm, nontender. Eyes: Conjunctivae clear without exudates or hemorrhage Neck: Supple, no carotid bruits. Pulmonary: Clear to auscultation bilaterally   NEUROLOGICAL EXAM:  MENTAL STATUS: Speech:    Speech is normal; fluent and spontaneous with normal comprehension.  Cognition:     Orientation to time, place and person     Normal recent and remote memory     Normal Attention span and concentration     Normal Language, naming, repeating,spontaneous speech     Fund of knowledge   CRANIAL NERVES: CN II: Visual fields are full to confrontation. Pupils are round equal and briskly reactive to light. CN III, IV, VI: extraocular movement are normal. No ptosis. CN V: Facial sensation is intact to light touch CN VII: Face is symmetric with normal eye closure  CN VIII: Hearing is normal to causal conversation. CN IX, X: Phonation is normal.  CN XI: Head turning and shoulder shrug are intact  MOTOR: There is no pronator drift of out-stretched arms. Muscle bulk and tone are normal. Muscle strength is normal.  REFLEXES: Reflexes are 2+ and symmetric at the biceps, triceps, knees, and ankles. Plantar responses are flexor.  SENSORY: Intact to light touch, pinprick and vibratory sensation are intact in fingers and  toes.  COORDINATION: There is no trunk or limb dysmetria noted.  GAIT/STANCE: Posture is normal. Gait is steady with normal steps, base, arm swing, and turning. Heel and toe walking are normal. Tandem gait is normal.  Romberg is absent.  REVIEW OF SYSTEMS:  Full 14 system review of systems performed and notable only for as above All other review of systems were negative.   ALLERGIES: No Known Allergies  HOME MEDICATIONS: Current Outpatient Medications  Medication Sig Dispense Refill   dicyclomine (BENTYL) 10 MG capsule Take 10 mg by mouth 4 (four) times daily as needed.     norelgestromin-ethinyl estradiol Marilu Favre) 150-35 MCG/24HR transdermal patch Place 1 patch onto the skin once a week.     SUMAtriptan (IMITREX) 50 MG tablet Take 1 tablet by mouth as needed.     topiramate (TOPAMAX) 100 MG tablet Take 1 tablet (100 mg total) by mouth 2 (two) times daily. 60 tablet 11   No current facility-administered medications for this visit.    PAST MEDICAL HISTORY: Past Medical History:  Diagnosis Date   PCOS (polycystic ovarian syndrome)    Polycystic disease, ovaries     PAST SURGICAL HISTORY: Past Surgical History:  Procedure Laterality Date   WISDOM TOOTH EXTRACTION      FAMILY HISTORY: History reviewed. No pertinent family history.  SOCIAL HISTORY: Social History   Socioeconomic History   Marital status: Married    Spouse name: Not on file   Number of children: Not on file   Years of education: Not on file   Highest education level: Not on file  Occupational History   Not on file  Tobacco Use   Smoking status: Never   Smokeless tobacco: Never  Substance and Sexual Activity   Alcohol use: No   Drug use: Not on file   Sexual activity: Not on file  Other Topics Concern   Not on file  Social History Narrative   Not on file   Social Determinants of Health   Financial Resource Strain: Not on file  Food Insecurity: Not on file  Transportation Needs: Not on  file  Physical Activity: Not on file  Stress: Not on file  Social Connections: Not on file  Intimate Partner Violence: Not on file      Marcial Pacas, M.D. Ph.D.  [facility] [address], [place], [place] [address] Ph: [phone] Fax: [phone]  CC:  Wanita Chamberlain, PA-C [place],  [place] [address]  Wanita Chamberlain, PA-C

## 2021-05-21 LAB — CBC WITH DIFFERENTIAL/PLATELET
Basophils Absolute: 0.1 10*3/uL (ref 0.0–0.2)
Basos: 1 %
EOS (ABSOLUTE): 0.2 10*3/uL (ref 0.0–0.4)
Eos: 2 %
Hematocrit: 38.8 % (ref 34.0–46.6)
Hemoglobin: 12.7 g/dL (ref 11.1–15.9)
Immature Grans (Abs): 0 10*3/uL (ref 0.0–0.1)
Immature Granulocytes: 0 %
Lymphocytes Absolute: 2.6 10*3/uL (ref 0.7–3.1)
Lymphs: 29 %
MCH: 28.9 pg (ref 26.6–33.0)
MCHC: 32.7 g/dL (ref 31.5–35.7)
MCV: 88 fL (ref 79–97)
Monocytes Absolute: 0.4 10*3/uL (ref 0.1–0.9)
Monocytes: 5 %
Neutrophils Absolute: 5.6 10*3/uL (ref 1.4–7.0)
Neutrophils: 63 %
Platelets: 390 10*3/uL (ref 150–450)
RBC: 4.39 x10E6/uL (ref 3.77–5.28)
RDW: 13.3 % (ref 11.7–15.4)
WBC: 8.9 10*3/uL (ref 3.4–10.8)

## 2021-05-21 LAB — COMPREHENSIVE METABOLIC PANEL
ALT: 11 IU/L (ref 0–32)
AST: 11 IU/L (ref 0–40)
Albumin/Globulin Ratio: 1.4 (ref 1.2–2.2)
Albumin: 4 g/dL (ref 3.9–5.0)
Alkaline Phosphatase: 67 IU/L (ref 44–121)
BUN/Creatinine Ratio: 16 (ref 9–23)
BUN: 11 mg/dL (ref 6–20)
Bilirubin Total: <0.2 mg/dL (ref 0.0–1.2)
CO2: 19 mmol/L — ABNORMAL LOW (ref 20–29)
Calcium: 9.5 mg/dL (ref 8.7–10.2)
Chloride: 103 mmol/L (ref 96–106)
Creatinine, Ser: 0.68 mg/dL (ref 0.57–1.00)
Globulin, Total: 2.9 g/dL (ref 1.5–4.5)
Glucose: 93 mg/dL (ref 70–99)
Potassium: 4.6 mmol/L (ref 3.5–5.2)
Sodium: 137 mmol/L (ref 134–144)
Total Protein: 6.9 g/dL (ref 6.0–8.5)
eGFR: 125 mL/min/{1.73_m2} (ref >59)

## 2021-05-21 LAB — TSH: TSH: 1.76 u[IU]/mL (ref 0.450–4.500)

## 2021-06-02 ENCOUNTER — Telehealth: Payer: Self-pay | Admitting: Neurology

## 2021-06-02 NOTE — Telephone Encounter (Signed)
Pt called stating that she had requested to switch providers due to a recommendation she was given. Pt states that she was recommended to see Dr. Lucia Gaskins for her Migraines and she would like to know if she can switch to be seen by her. Please advise.

## 2021-06-03 ENCOUNTER — Telehealth: Payer: Self-pay | Admitting: Neurology

## 2021-06-03 DIAGNOSIS — G43709 Chronic migraine without aura, not intractable, without status migrainosus: Secondary | ICD-10-CM

## 2021-06-03 DIAGNOSIS — R519 Headache, unspecified: Secondary | ICD-10-CM

## 2021-06-03 DIAGNOSIS — R9389 Abnormal findings on diagnostic imaging of other specified body structures: Secondary | ICD-10-CM

## 2021-06-03 DIAGNOSIS — E348 Other specified endocrine disorders: Secondary | ICD-10-CM

## 2021-06-03 NOTE — Addendum Note (Signed)
Addended by: Lindell Spar C on: 06/03/2021 12:23 PM   Modules accepted: Orders

## 2021-06-03 NOTE — Telephone Encounter (Signed)
Pt called in asking [place] if we could send a referral over for her to see Dr. Dione Booze, please advise.

## 2021-06-03 NOTE — Telephone Encounter (Signed)
Sent to [facility] ph # [phone] 

## 2021-06-03 NOTE — Telephone Encounter (Signed)
Last office note on 05/20/21:  Also, advised her to follow-up with ophthalmology evaluation.  ________________________________  Orders placed in Epic.

## 2021-06-03 NOTE — Telephone Encounter (Signed)
It is ok to switch 

## 2021-06-09 NOTE — Telephone Encounter (Signed)
Spoke with patient and discussed the message from Dr. Lucia Gaskins.  Patient aware Dr Terrace Arabia and Dr Lucia Gaskins ok with switch, however, per Dr Lucia Gaskins, plan to follow-up with Shanda Bumps in April and that Dr Lucia Gaskins agrees with Dr Zannie Cove assessment and plan.  Patient on topiramate, Dr. Lucia Gaskins agrees.  Shanda Bumps can come to Dr. Lucia Gaskins with questions and we can follow-up in April and if Topiramate not helping, Shanda Bumps can discuss other medication options we usually try. Pt verbalized understanding and appreciation for the call.

## 2021-06-10 ENCOUNTER — Encounter: Payer: Self-pay | Admitting: Neurology

## 2021-06-10 NOTE — Telephone Encounter (Signed)
LVM

## 2021-07-04 ENCOUNTER — Ambulatory Visit: Payer: BC Managed Care – PPO | Admitting: Neurology

## 2021-08-15 ENCOUNTER — Other Ambulatory Visit (HOSPITAL_COMMUNITY): Payer: Self-pay | Admitting: Physician Assistant

## 2021-08-15 DIAGNOSIS — S14114A Complete lesion at C4 level of cervical spinal cord, initial encounter: Secondary | ICD-10-CM

## 2021-08-15 DIAGNOSIS — R9089 Other abnormal findings on diagnostic imaging of central nervous system: Secondary | ICD-10-CM

## 2021-08-19 ENCOUNTER — Ambulatory Visit: Payer: BC Managed Care – PPO | Admitting: Adult Health

## 2021-08-22 ENCOUNTER — Ambulatory Visit (HOSPITAL_COMMUNITY)
Admission: RE | Admit: 2021-08-22 | Discharge: 2021-08-22 | Disposition: A | Discharge status: Home / Self Care | Payer: BC Managed Care – PPO | Source: Ambulatory Visit | Attending: Physician Assistant | Admitting: Physician Assistant

## 2021-08-22 DIAGNOSIS — S14114A Complete lesion at C4 level of cervical spinal cord, initial encounter: Secondary | ICD-10-CM | POA: Diagnosis present

## 2021-08-26 NOTE — Progress Notes (Signed)
? ?NEUROLOGY CONSULTATION NOTE ? ?Shelly Carpenter ?MRN: [id number] ?DOB: June 29, 1995 ? ?Referring provider: [name], PA-C ?Primary care provider: [name], PA-C ? ?Reason for consult:  second opinion ? ?Assessment/Plan:  ? ?Migraine without aura, without status migrainosus, not intractable  ?Spinal pain - unclear etiology.  Not consistent with radiculopathy but not classic presentation for muscular or arthritic. ?Dizziness - may be peripheral vertigo ? ?Migraine prevention:  Continue topiramate 100mg  twice daily.  As headaches are aggravated by cervical/back pain, will start gabapentin 300mg  twice daily (as effective in treating the back pain) ?Migraine rescue:  She will try sumatriptan.  If ineffective, she will contact me and we can try an alternative medication.  Declines antiemetic at this time. ?Limit use of pain relievers to no more than 2 days out of week to prevent risk of rebound or medication-overuse headache. ?Keep headache diary ?For further evaluation of spinal pain, check X-ray of thoracic and lumbar spine. ?Follow up 4 months. ? ? ? ?Subjective:  ?Shelly Carpenter is a 26 year old right-handed female with migraines and PCOS who presents for second opinion regarding possible multiple sclerosis.  History supplemented by neurologist's and referring provider's notes.  MRI of brain and cervical spine personally reviewed. ? ?Since early 2022, she has had headaches and dizziness. ?- Dizziness described as spinning and lightheadedness, sometimes feeling like she will pass out but hasn't.  Associated nausea.  Lasts at least 30-45 seconds but not necessarily with movement.  This occurs once every 2 months. ?- Bilateral tinnitus (ringing). ?- Headaches are moderate to severe posterior headaches, pressure like and sharp in quality with associated posterior neck pain, nausea, photophobia, phonophobia and blurred vision.  Still able to function.  They were initially occurring 3-4 times a week.  She started  topiramate and frequency decreased to 2-3 times a month.  They last 2 hours with Tylenol but all day untreated. However, in February, she developed midline spinal pain from the neck down to lumbar region.  Arms and legs may shake but no radicular pain or weakness of extremities.  Denies injury, strenuous activity or other precipitating event.  Constant and not aggravated by movement or prolonged inactivity.  This pain has increased headache frequency to 2 times a week.   She was prescribed anti-inflammatories and muscle relaxers (unsure of name) with no improvement.  She tried a family member's gabapentin (300mg  twice daily) which helped.  In early January 2023, she had one particular headache that was severe and associated with black and white out of vision.  She needed to leave work.  It responded quickly (within 1 hour) to Nurtec but her insurance wouldn't approve it.   ? ?Reports history of headaches as a child that became infrequent by college, a mild diffuse pressure headache without associated symptoms. ? 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  Follow up MRI of cervical spine confirmed hemangioma and no spinal cord abnormalities.  She had an eye exam that was unremarkable. ?  ?Current NSAIDs/analgesics:  Tylenol (infrequently used) ?Current antiepileptic:  topiramate 100mg  BID ?Current birth control/hormone:  Xulane patch ?Prescribed sumatriptan 50mg  but hasn't tried it. ? ? ?Coffee rarely;  no soda ?Improved diet - increased water intake ?Excerise unitl back pain spinal pain ?No family history of migraine. ? ?PAST MEDICAL HISTORY: ?Past Medical  History:  ?Diagnosis Date  ? PCOS (polycystic ovarian syndrome)   ? Polycystic disease, ovaries   ? ? ?PAST SURGICAL HISTORY: ?Past Surgical History:  ?Procedure  Laterality Date  ? WISDOM TOOTH EXTRACTION    ? ? ?MEDICATIONS: ?Current Outpatient Medications on File Prior to Visit  ?Medication Sig Dispense Refill  ? dicyclomine (BENTYL) 10 MG capsule Take 10 mg by mouth 4 (four) times daily as needed.    ? norelgestromin-ethinyl estradiol Burr Medico) 150-35 MCG/24HR transdermal patch Place 1 patch onto the skin once a week.    ? SUMAtriptan (IMITREX) 50 MG tablet Take 1 tablet by mouth as needed.    ? topiramate (TOPAMAX) 100 MG tablet Take 1 tablet (100 mg total) by mouth 2 (two) times daily. 60 tablet 11  ? ?No current facility-administered medications on file prior to visit.  ? ? ?ALLERGIES: ?No Known Allergies ? ?FAMILY HISTORY: ?No family history on file. ? ?Objective:  ?Blood pressure 139/84, pulse 84, resp. rate 18, height 5\' 3"  (1.6 m), weight 230 lb (104.3 kg), SpO2 100 %. ?General: No acute distress.  Patient appears well-groomed.   ?Head:  Normocephalic/atraumatic ?Eyes:  fundi examined but not visualized ?Neck: supple, mild paraspinal tenderness, full range of motion ?Back: mild paraspinal tenderness ?Heart: regular rate and rhythm ?Lungs: Clear to auscultation bilaterally. ?Vascular: No carotid bruits. ?Neurological Exam: ?Mental status: alert and oriented to person, place, and time, recent and remote memory intact, fund of knowledge intact, attention and concentration intact, speech fluent and not dysarthric, language intact. ?Cranial nerves: ?CN I: not tested ?CN II: pupils equal, round and reactive to light, visual fields intact ?CN III, IV, VI:  full range of motion, no nystagmus, no ptosis ?CN V: facial sensation intact. ?CN VII: upper and lower face symmetric ?CN VIII: hearing intact ?CN IX, X: gag intact, uvula midline ?CN XI: sternocleidomastoid and trapezius muscles intact ?CN XII: tongue midline ?Bulk & Tone: normal, no fasciculations. ?Motor:  muscle strength 5/5 throughout ?Sensation:  Pinprick, temperature and vibratory sensation intact. ?Deep Tendon  Reflexes:  2+ throughout,  toes downgoing.   ?Finger to nose testing:  Without dysmetria.   ?Heel to shin:  Without dysmetria.   ?Gait:  Normal station and stride.  Romberg negative. ? ? ? ?Thank you for allowing me to take part in the care of this patient. ? ? , DO ? ?CC: Shon Millet, PA-C ? ? ? ? ?

## 2021-08-28 ENCOUNTER — Other Ambulatory Visit: Payer: Self-pay | Admitting: Neurology

## 2021-08-28 ENCOUNTER — Ambulatory Visit (INDEPENDENT_AMBULATORY_CARE_PROVIDER_SITE_OTHER): Payer: BC Managed Care – PPO | Admitting: Neurology

## 2021-08-28 ENCOUNTER — Ambulatory Visit
Admission: RE | Admit: 2021-08-28 | Discharge: 2021-08-28 | Disposition: A | Discharge status: Home / Self Care | Payer: BC Managed Care – PPO | Source: Ambulatory Visit | Attending: Neurology | Admitting: Neurology

## 2021-08-28 ENCOUNTER — Encounter: Payer: Self-pay | Admitting: Neurology

## 2021-08-28 VITALS — BP 139/84 | HR 84 | Resp 18 | Ht 63.0 in | Wt 230.0 lb

## 2021-08-28 DIAGNOSIS — G8929 Other chronic pain: Secondary | ICD-10-CM

## 2021-08-28 DIAGNOSIS — G43009 Migraine without aura, not intractable, without status migrainosus: Secondary | ICD-10-CM

## 2021-08-28 DIAGNOSIS — R42 Dizziness and giddiness: Secondary | ICD-10-CM | POA: Diagnosis not present

## 2021-08-28 DIAGNOSIS — M549 Dorsalgia, unspecified: Secondary | ICD-10-CM

## 2021-08-28 DIAGNOSIS — M542 Cervicalgia: Secondary | ICD-10-CM

## 2021-08-28 MED ORDER — GABAPENTIN 300 MG PO CAPS: 300.0000 mg | ORAL_CAPSULE | Freq: Two times a day (BID) | ORAL | 5 refills | Status: DC

## 2021-08-28 MED ORDER — TOPIRAMATE 100 MG PO TABS: 100.0000 mg | ORAL_TABLET | Freq: Two times a day (BID) | ORAL | 5 refills | Status: DC

## 2021-08-28 NOTE — Patient Instructions (Signed)
?  Continue topiramate 100mg  twice daily.   ?Start gabapentin 300mg  twice daily for back pain ?Take sumatriptan 50mg  at earliest onset of headache.  May repeat dose once in 2 hours if needed.  Maximum 2 tablets in 24 hours.  If not effective, let me know and we can try a different medication. ?Check Xray of thoracic and lumbar spine ?Limit use of pain relievers to no more than 2 days out of the week.  These medications include acetaminophen, NSAIDs (ibuprofen/Advil/Motrin, naproxen/Aleve, triptans (Imitrex/sumatriptan), Excedrin, and narcotics.  This will help reduce risk of rebound headaches. ?Be aware of common food triggers: ? - Caffeine:  coffee, black tea, cola, Mt. Dew ? - Chocolate ? - Dairy:  aged cheeses (brie, blue, cheddar, gouda, [place], provolone, [place], Swiss, etc), chocolate milk, buttermilk, sour cream, limit eggs and yogurt ? - Nuts, peanut butter ? - Alcohol ? - Cereals/grains:  FRESH breads (fresh bagels, sourdough, doughnuts), yeast productions ? - Processed/canned/aged/cured meats (pre-packaged deli meats, hotdogs) ? - MSG/glutamate:  soy sauce, flavor enhancer, pickled/preserved/marinated foods ? - Sweeteners:  aspartame (Equal, Nutrasweet).  Sugar and Splenda are okay ? - Vegetables:  legumes (lima beans, lentils, snow peas, fava beans, pinto peans, peas, garbanzo beans), sauerkraut, onions, olives, pickles ? - Fruit:  avocados, bananas, citrus fruit (orange, lemon, grapefruit), mango ? - Other:  Frozen meals, macaroni and cheese ?Routine exercise ?Stay adequately hydrated (aim for 64 oz water daily) ?Keep headache diary ?Maintain proper stress management ?Maintain proper sleep hygiene ?Do not skip meals ?Consider supplements:  magnesium citrate 400mg  daily, riboflavin 400mg  daily, coenzyme Q10 100mg  three times daily. ? ?

## 2021-10-07 ENCOUNTER — Telehealth: Payer: Self-pay | Admitting: Neurology

## 2021-10-07 NOTE — Telephone Encounter (Signed)
I got records from Dr. Dione Booze office visit 06/05/21 VF is full, OCT is normal. No signs of IIH. Looks like switched from Dr. Terrace Arabia to Dr. Lucia Gaskins, now is seeing Dr. Everlena Cooper. I guess we can confirm she isn't seeing our office anymore. I don't think she needs 2 neurologist.

## 2021-11-24 ENCOUNTER — Telehealth: Payer: Self-pay | Admitting: Neurology

## 2021-11-24 NOTE — Telephone Encounter (Signed)
Patient called and left a voice mail requesting a call back about some medication questions she has.

## 2021-11-24 NOTE — Telephone Encounter (Signed)
Per patient her GYN changed her Birth Control to an estrogen free method. Patient wanted to know if Dr.Jaffe recommend decreasing her Topriamate.  Since the change in birth control her migraines have improved some.

## 2021-11-25 NOTE — Telephone Encounter (Signed)
Pt called and informed of results Per Dr. Everlena Cooper. She repeated it to me and understood.,

## 2021-11-25 NOTE — Telephone Encounter (Signed)
Pt called back in to see if she can get an update on Dr. Moises Blood recommendations

## 2022-02-02 NOTE — Progress Notes (Signed)
NEUROLOGY FOLLOW UP OFFICE NOTE  Shelly Carpenter [id number]  Assessment/Plan:   Migraine without aura, without status migrainosus, not intractable  Spinal pain - unclear etiology.  Not consistent with radiculopathy but not classic presentation for muscular or arthritic. Dizziness - may be peripheral vertigo   Migraine prevention:  Taper off topiramate and start venlafaxine XR 37.5mg  daily for one week, then increase to 75mg  daily.  As headaches are aggravated by cervical/back pain, continue gabapentin 300mg  twice daily (as effective in treating the back pain) Migraine rescue:  Discontinue sumatriptan.  Start rizatriptan 10mg  Limit use of pain relievers to no more than 2 days out of week to prevent risk of rebound or medication-overuse headache. Keep headache diary For further evaluation of spinal pain, check X-ray of thoracic and lumbar spine. Follow up 4 months.       Subjective:  Shelly Carpenter is a 26 year old right-handed female with migraines and PCOS who follows up for migraine.  UPDATE: Her gynecologist changed her birth control to an estrogen-free medication which had greatly improved her migraines.  We decreased topiramate to 50mg  twice daily.   Intensity:  severe Duration:  all day with sumatriptan.   Frequency:  5-6 in last 30 days  In one year, she may start trying to conceive.    Current NSAIDS/analgesics:  Tylenol Current triptans:  sumatriptan 50mg  tab Current ergotamine:  none Current anti-emetic:  none Current muscle relaxants:  none Current Antihypertensive medications:  none Current Antidepressant medications:  none Current Anticonvulsant medications:  topiramate 50mg  BID, gabapentin 300mg  BID (for cervical/back pain) Current anti-CGRP:  none Current Vitamins/Herbal/Supplements:  none Current Antihistamines/Decongestants:  none Other therapy:  none Current birth control:  Micronor  Caffeine:  rarely coffee; no soda Diet:  improved.  Increased water  intake Exercise:  not since flare of spinal pain Other pain:  neck/back pain   HISTORY:  Since early 2022, she has had headaches and dizziness. - Dizziness described as spinning and lightheadedness, sometimes feeling like she will pass out but hasn't.  Associated nausea.  Lasts at least 30-45 seconds but not necessarily with movement.  This occurs once every 2 months. - Bilateral tinnitus (ringing). - Headaches are moderate to severe posterior headaches, pressure like and sharp in quality with associated posterior neck pain, nausea, photophobia, phonophobia and blurred vision.  Still able to function.  They were initially occurring 3-4 times a week.  She started topiramate and frequency decreased to 2-3 times a month.  They last 2 hours with Tylenol but all day untreated. However, in February, she developed midline spinal pain from the neck down to lumbar region.  Arms and legs may shake but no radicular pain or weakness of extremities.  Denies injury, strenuous activity or other precipitating event.  Constant and not aggravated by movement or prolonged inactivity.  This pain has increased headache frequency to 2 times a week.   She was prescribed anti-inflammatories and muscle relaxers (unsure of name) with no improvement.  She tried a family member's gabapentin (300mg  twice daily) which helped.  In early January 2023, she had one particular headache that was severe and associated with black and white out of vision.  She needed to leave work.  It responded quickly (within 1 hour) to Nurtec but her insurance wouldn't approve it.     Reports history of headaches as a child that became infrequent by college, a mild diffuse pressure headache without associated symptoms.   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  Follow up MRI of cervical spine confirmed hemangioma and no spinal cord abnormalities.  She had an eye exam that was unremarkable.   No family history of migraines  PAST MEDICAL HISTORY: Past Medical History:  Diagnosis Date   PCOS (polycystic ovarian syndrome)    Polycystic disease, ovaries     MEDICATIONS: Current Outpatient Medications on File Prior to Visit  Medication Sig Dispense Refill   dicyclomine (BENTYL) 10 MG capsule Take 10 mg by mouth 4 (four) times daily as needed. (Patient not taking: Reported on 08/28/2021)     gabapentin (NEURONTIN) 300 MG capsule Take 1 capsule (300 mg total) by mouth 2 (two) times daily. 60 capsule 5   norelgestromin-ethinyl estradiol (XULANE) 150-35 MCG/24HR transdermal patch Place 1 patch onto the skin once a week.     SUMAtriptan (IMITREX) 50 MG tablet Take 1 tablet by mouth as needed. (Patient not taking: Reported on 08/28/2021)     topiramate (TOPAMAX) 100 MG tablet Take 1 tablet (100 mg total) by mouth 2 (two) times daily. 60 tablet 5   No current facility-administered medications on file prior to visit.    ALLERGIES: No Known Allergies  FAMILY HISTORY: No family history on file.    Objective:  Blood pressure 124/72, pulse 84, height 5\' 3"  (1.6 m), weight 207 lb 9.6 oz (94.2 kg), SpO2 99 %. General: No acute distress.  Patient appears we;;-groomed.      Metta Clines, DO  CC: Wanita Chamberlain, PA-C

## 2022-02-03 ENCOUNTER — Ambulatory Visit (INDEPENDENT_AMBULATORY_CARE_PROVIDER_SITE_OTHER): Payer: BC Managed Care – PPO | Admitting: Neurology

## 2022-02-03 ENCOUNTER — Encounter: Payer: Self-pay | Admitting: Neurology

## 2022-02-03 ENCOUNTER — Telehealth: Payer: Self-pay

## 2022-02-03 ENCOUNTER — Other Ambulatory Visit: Payer: Self-pay | Admitting: Neurology

## 2022-02-03 VITALS — BP 124/72 | HR 84 | Ht 63.0 in | Wt 207.6 lb

## 2022-02-03 DIAGNOSIS — G43009 Migraine without aura, not intractable, without status migrainosus: Secondary | ICD-10-CM

## 2022-02-03 MED ORDER — VENLAFAXINE HCL ER 37.5 MG PO CP24: ORAL_CAPSULE | ORAL | 0 refills | Status: DC

## 2022-02-03 MED ORDER — RIZATRIPTAN BENZOATE 10 MG PO TBDP: 10.0000 mg | ORAL_TABLET | ORAL | 11 refills | Status: DC | PRN

## 2022-02-03 MED ORDER — VENLAFAXINE HCL ER 75 MG PO CP24: 75.0000 mg | ORAL_CAPSULE | Freq: Every day | ORAL | 0 refills | Status: DC

## 2022-02-03 NOTE — Telephone Encounter (Signed)
Per patient pharmacy, Please send in two Scripts for the Venlafaxine.  37.5 for one week and 75 mg for the month. The insurance will not cover two pills.   Per Dr.Jaffe okay to Venlafaxine 37.5 mg daily  X 1 week. And Venlafaxine 75 mg daily 0 Refills.

## 2022-02-03 NOTE — Patient Instructions (Signed)
Start venlafaxine 1 pill every morning with breakfast for one week, then increase to 2 pills every morning with breakfast.  If no improvement in 2 months, contact me. Decrease topiramate to 50mg  at bedtime for one week, then STOP Stop sumatriptan.  Instead, take rizatriptan 10mg .  May repeat after 2 hours.  Maximum 2 in 24 hours.  Let me know if ineffective Limit use of pain relievers to no more than 2 days out of week to prevent risk of rebound or medication-overuse headache. Keep headache diary Follow up 4-5 months.

## 2022-03-16 ENCOUNTER — Other Ambulatory Visit: Payer: Self-pay | Admitting: Neurology

## 2022-03-17 ENCOUNTER — Telehealth: Payer: Self-pay | Admitting: Neurology

## 2022-03-17 NOTE — Telephone Encounter (Signed)
Patient called to give an update on starting venlafaxine. She said first couple of weeks were tough due to migraines, they have gotten a bit better. She said the venlafaxine is helping her with other things though. She'd like to continue on it for now.  For her rescue medication, rizatriptan, it makes her so nauseas and sleepy she has to go to sleep to help. She is afraid to take it while away from home.  1. Which medications need refilled? (List name and dosage, if known) venlafaxine 5 Mg    2. Which pharmacy/location is medication to be sent to? (include street and city if local pharmacy) [facility] [place]  3. Do they need a 30 day or 90 day supply? 90

## 2022-03-18 ENCOUNTER — Other Ambulatory Visit: Payer: Self-pay | Admitting: Neurology

## 2022-03-18 MED ORDER — VENLAFAXINE HCL ER 75 MG PO CP24: 75.0000 mg | ORAL_CAPSULE | Freq: Every day | ORAL | 1 refills | Status: DC

## 2022-03-18 NOTE — Telephone Encounter (Signed)
Patient advised Of Dr.Jaffe note,  I sent in the prescription for the venlafaxine.  Would she be able to come by the office to pick up samples?  Instead of rizatriptan, I would like her to try [name] - take one tablet as needed.  May repeat after 2 hours (maximum 2 tablets in 24 hours).  It typically is non-drowsy and well-tolerated.      Patient to come by 03/19/22 to pick up samples.

## 2022-06-02 ENCOUNTER — Telehealth: Payer: Self-pay | Admitting: Neurology

## 2022-06-02 MED ORDER — UBRELVY 100 MG PO TABS
100.0000 mg | ORAL_TABLET | ORAL | 5 refills | Status: AC | PRN
Start: 2022-06-02 — End: 2022-07-02

## 2022-06-02 NOTE — Telephone Encounter (Signed)
Pt called in stating she is running out of samples for her rescue headache medication. She doesn't remember the name of it, but she says the box was green. She is wanting to get more. She says when she takes it at the right time it works well. [facility] [address] in [place].

## 2022-06-09 ENCOUNTER — Telehealth: Payer: Self-pay | Admitting: Pharmacy Technician

## 2022-06-09 NOTE — Telephone Encounter (Signed)
Patient Advocate Encounter   Received notification that prior authorization for Ubrelvy 100MG  tablets is required.   PA submitted on 06/09/2022 Key [id number] Status is pending       Lyndel Safe, [place] Patient Advocate Specialist [place] Patient Advocate Team Direct Number: [phone]  Fax: [phone]

## 2022-06-09 NOTE — Telephone Encounter (Signed)
PA has been APPROVED from 06/09/2022 through 08/31/2022

## 2022-06-10 NOTE — Telephone Encounter (Signed)
Pt called in stating [facility] [place] and they do not show that her Shelly Carpenter prior Shelly Carpenter has been approved. She would like to see if that approval can be sent to them?

## 2022-06-11 ENCOUNTER — Telehealth: Payer: Self-pay | Admitting: Neurology

## 2022-06-11 NOTE — Telephone Encounter (Signed)
Spoke to Pharmacy patient may pick up. Script to be changed to 8 tabs for 30 days.   Tried calling patient no answer.

## 2022-06-11 NOTE — Telephone Encounter (Signed)
Patient left message with after hour service 06-11-22 @ 12:03 pm   Returning a call to office

## 2022-06-12 NOTE — Telephone Encounter (Signed)
LMOVM for patient, Shelly Carpenter is covered with 8 tabs for 30 days.

## 2022-07-03 NOTE — Progress Notes (Unsigned)
NEUROLOGY FOLLOW UP OFFICE NOTE  EXA CHOHAN [id number]  Assessment/Plan:   Migraine without aura, without status migrainosus, not intractable  Spinal pain - unclear etiology.  Not consistent with radiculopathy but not classic presentation for muscular or arthritic. Dizziness- may be peripheral vertigo   Migraine prevention:  venlafaxine XR '75mg'$ ; gabapentin '300mg'$  twice daily (neck/back pain) *** Migraine rescue:  Ubrelvy '100mg'$  *** Limit use of pain relievers to no more than 2 days out of week to prevent risk of rebound or medication-overuse headache. Keep headache diary Follow up ***       Subjective:  ZAPHIRA WEIMAN is a 27 year old right-handed female with migraines and PCOS who follows up for migraine.   UPDATE: Last appointment, transitioned from topiramate to venlafaxine.  Rizatriptan ineffective.  Switched to [place].  Intensity:  severe Duration:  *** Frequency:  ***   In one year, she may start trying to conceive.     Current NSAIDS/analgesics:  Tylenol Current triptans: none Current ergotamine:  none Current anti-emetic:  none Current muscle relaxants:  none Current Antihypertensive medications:  none Current Antidepressant medications:  venlafaxine XR '75mg'$  daily Current Anticonvulsant medications:  gabapentin '300mg'$  BID (for cervical/back pain) Current anti-CGRP:  Ubrelvy '100mg'$  Current Vitamins/Herbal/Supplements:  none Current Antihistamines/Decongestants:  none Other therapy:  none Current birth control:  Micronor   Caffeine:  rarely coffee; no soda Diet:  improved.  Increased water intake Exercise:  not since flare of spinal pain Other pain:  neck/back pain     HISTORY:  Since early 2022, she has had headaches and dizziness. - Dizziness described as spinning and lightheadedness, sometimes feeling like she will pass out but hasn't.  Associated nausea.  Lasts at least 30-45 seconds but not necessarily with movement.  This occurs once every 2  months. - Bilateral tinnitus (ringing). - Headaches are moderate to severe posterior headaches, pressure like and sharp in quality with associated posterior neck pain, nausea, photophobia, phonophobia and blurred vision.  Still able to function.  They were initially occurring 3-4 times a week.  She started topiramate and frequency decreased to 2-3 times a month.  They last 2 hours with Tylenol but all day untreated. However, in February 2023, she developed midline spinal pain from the neck down to lumbar region.  Arms and legs may shake but no radicular pain or weakness of extremities.  Denies injury, strenuous activity or other precipitating event.  Constant and not aggravated by movement or prolonged inactivity.  This pain has increased headache frequency to 2 times a week.   She was prescribed anti-inflammatories and muscle relaxers (unsure of name) with no improvement.  She tried a family member's gabapentin ('300mg'$  twice daily) which helped.  In early January 2023, she had one particular headache that was severe and associated with black and white out of vision.  She needed to leave work.  It responded quickly (within 1 hour) to Nurtec but her insurance wouldn't approve it.     Reports history of headaches as a child that became infrequent by college, a mild diffuse pressure headache without associated symptoms.   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  Follow up MRI of cervical spine on 08/22/2021 confirmed hemangioma and no spinal cord abnormalities.  She had an eye exam that was unremarkable.  X-rays  of thoracic and lumbar spine on 08/28/2021 were negative.   No family history of migraines  Past triptan:  sumatriptan tab, rizatriptan Past antiepileptic:  topiramate.  PAST MEDICAL HISTORY: Past  Medical History:  Diagnosis Date   PCOS (polycystic ovarian syndrome)    Polycystic disease, ovaries     MEDICATIONS: Current Outpatient Medications on File Prior to Visit  Medication Sig Dispense Refill   gabapentin (NEURONTIN) 300 MG capsule Take 1 capsule (300 mg total) by mouth 2 (two) times daily. 60 capsule 5   metFORMIN (GLUCOPHAGE) 500 MG tablet Take by mouth.     norethindrone (MICRONOR) 0.35 MG tablet Take 1 tablet by mouth daily.     rizatriptan (MAXALT-MLT) 10 MG disintegrating tablet Take 1 tablet (10 mg total) by mouth as needed for migraine (May repeat after 2 hours.  maximum 2 tablets in 24 hours.). May repeat in 2 hours if needed 9 tablet 11   venlafaxine XR (EFFEXOR XR) 75 MG 24 hr capsule Take 1 capsule (75 mg total) by mouth daily with breakfast. 90 capsule 1   No current facility-administered medications on file prior to visit.    ALLERGIES: No Known Allergies  FAMILY HISTORY: No family history on file.    Objective:  *** General: No acute distress.  Patient appears ***-groomed.   Head:  Normocephalic/atraumatic Eyes:  Fundi examined but not visualized Neck: supple, no paraspinal tenderness, full range of motion Heart:  Regular rate and rhythm Lungs:  Clear to auscultation bilaterally Back: No paraspinal tenderness Neurological Exam: alert and oriented to person, place, and time.  Speech fluent and not dysarthric, language intact.  CN II-XII intact. Bulk and tone normal, muscle strength 5/5 throughout.  Sensation to light touch intact.  Deep tendon reflexes 2+ throughout, toes downgoing.  Finger to nose testing intact.  Gait normal, Romberg negative.   Metta Clines, DO  CC: ***

## 2022-07-06 ENCOUNTER — Encounter: Payer: Self-pay | Admitting: Neurology

## 2022-07-06 ENCOUNTER — Ambulatory Visit (INDEPENDENT_AMBULATORY_CARE_PROVIDER_SITE_OTHER): Payer: BC Managed Care – PPO | Admitting: Neurology

## 2022-07-06 VITALS — BP 133/79 | HR 85 | Ht 63.0 in | Wt 254.6 lb

## 2022-07-06 DIAGNOSIS — G43009 Migraine without aura, not intractable, without status migrainosus: Secondary | ICD-10-CM

## 2022-07-06 DIAGNOSIS — G8929 Other chronic pain: Secondary | ICD-10-CM | POA: Diagnosis not present

## 2022-07-06 DIAGNOSIS — M549 Dorsalgia, unspecified: Secondary | ICD-10-CM | POA: Diagnosis not present

## 2022-07-06 MED ORDER — NORTRIPTYLINE HCL 10 MG PO CAPS: 10.0000 mg | ORAL_CAPSULE | Freq: Every day | ORAL | 5 refills | Status: DC

## 2022-07-06 MED ORDER — VENLAFAXINE HCL ER 37.5 MG PO CP24: 37.5000 mg | ORAL_CAPSULE | Freq: Every day | ORAL | 0 refills | Status: DC

## 2022-07-06 MED ORDER — UBRELVY 100 MG PO TABS: 1.0000 | ORAL_TABLET | ORAL | 5 refills | Status: DC | PRN

## 2022-07-06 NOTE — Patient Instructions (Signed)
Take venlafaxine XR 37.'5mg'$  daily for one week, then stop Then start nortriptyline '10mg'$  at bedtime.  If no improvement in 4 weeks (just prior to refill), contact me and we can increase dose Ubrelvy as needed Consider taking magnesium citrate '400mg'$  daily and riboflavin '400mg'$  daily Follow up in  4 to 5 months.

## 2022-07-30 ENCOUNTER — Other Ambulatory Visit: Payer: Self-pay | Admitting: Neurology

## 2022-08-10 IMAGING — MR MR HEAD W/O CM
8 of 13 series · 29 of 48 positions shown · non-contrast
Comparison: No pertinent prior exams available for comparison.

CLINICAL DATA: Provided history: Migraine without [place] and without
status migrainosus, not intractable. Tinnitus, unspecified
laterality.

EXAM:
MRI HEAD WITHOUT CONTRAST
TECHNIQUE: Multiplanar, multiecho pulse sequences of the brain and surrounding
structures were obtained without intravenous contrast.

[Series 3: DWI · axial · 3.0mm · 1.09mm/px · z∈[-77,+76]mm · 6 of 104 slices shown (1 of 4)]
[im 1/104]
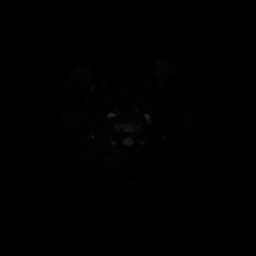
[im 21/104]
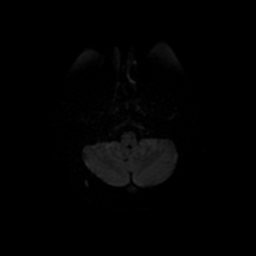
[im 42/104]
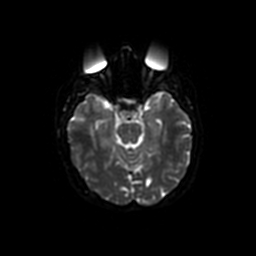
[im 62/104]
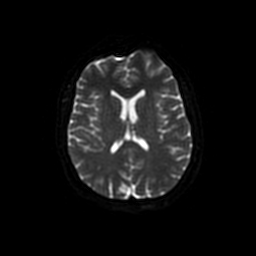
[im 83/104]
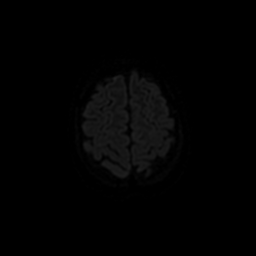
[im 104/104]
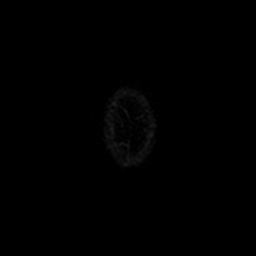

[Series 4: DWI · coronal · 5.0mm · 1.09mm/px · 4 of 80 slices shown (2 of 4)]
[im 1/80]
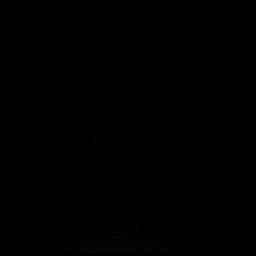
[im 27/80]
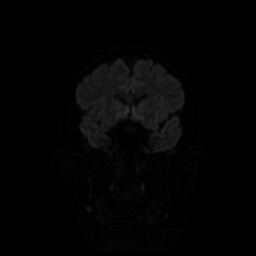
[im 53/80]
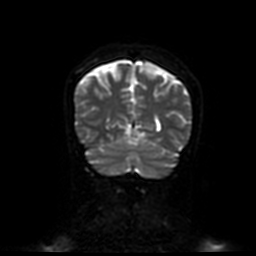
[im 80/80]
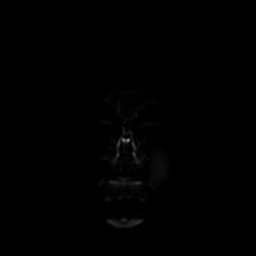

[Series 6: T2 · axial · 5.0mm · 0.43mm/px · 1 of 25 slices shown (1 of 2)]
[im 1/25]
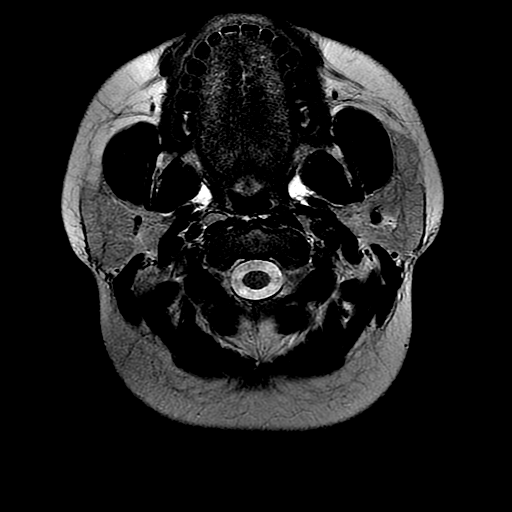

[Series 7: FLAIR · axial · 3.0mm · 0.43mm/px · 1 of 25 slices shown (1 of 2)]
[im 1/25]
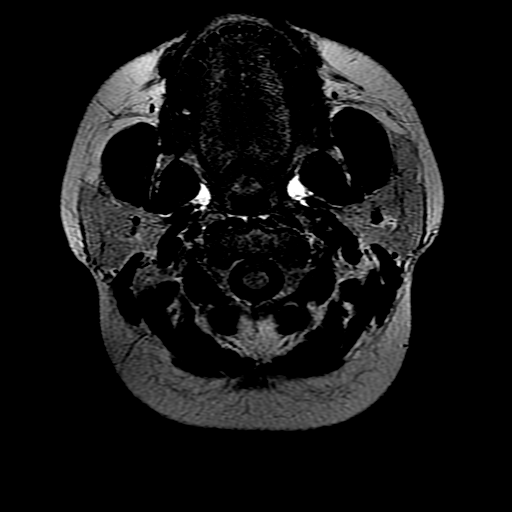

[Series 10: T2 · coronal · 5.0mm · 0.39mm/px · 1 of 25 slices shown (2 of 2)]
[im 1/25]
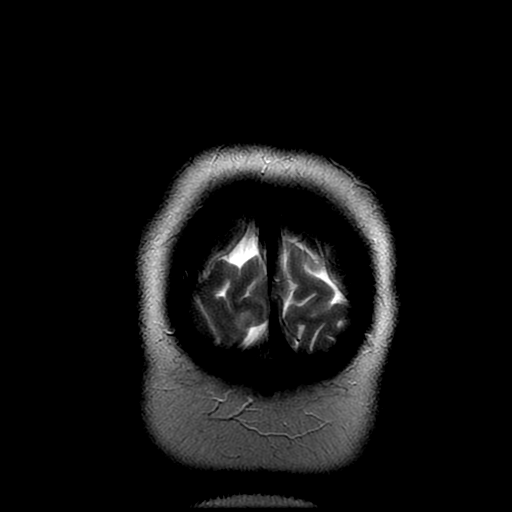

[Series 11: FLAIR · sagittal · 1.2mm · 0.51mm/px · 11 of 320 slices shown (2 of 2)]
[im 20/320]
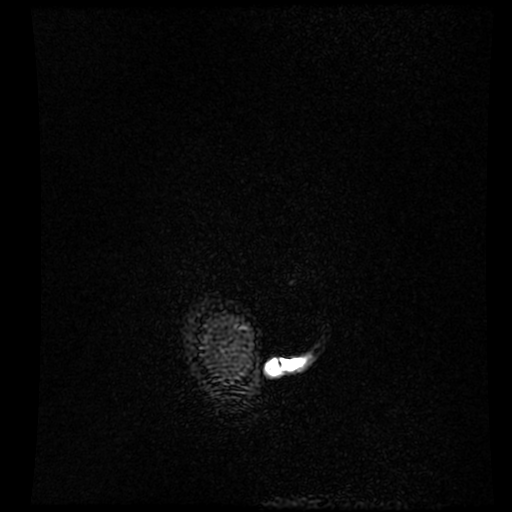
[im 40/320]
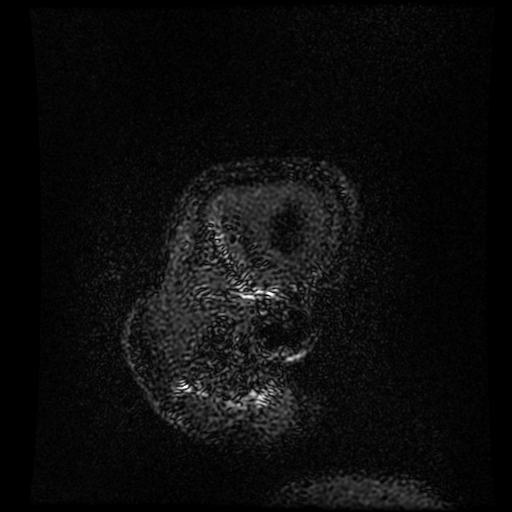
[im 60/320]
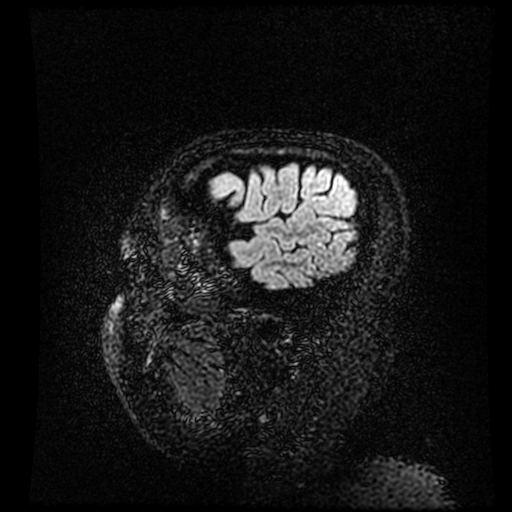
[im 100/320]
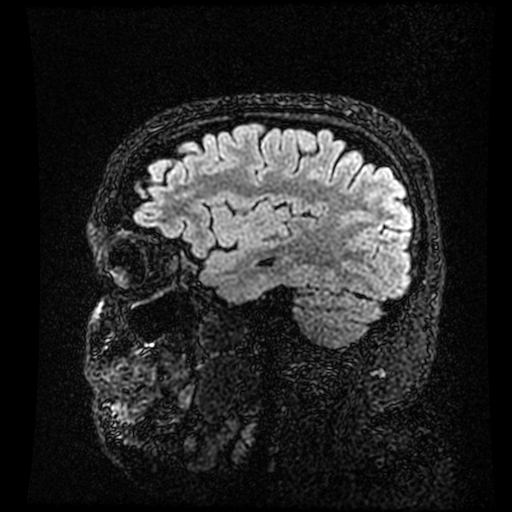
[im 140/320]
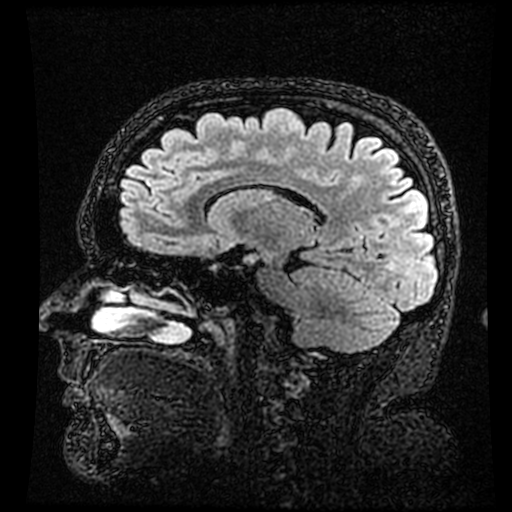
[im 160/320]
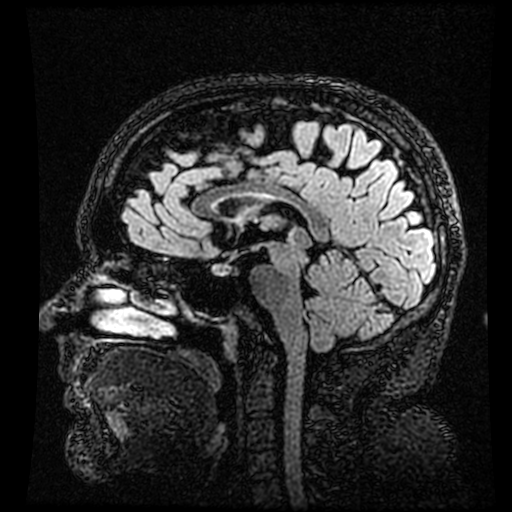
[im 180/320]
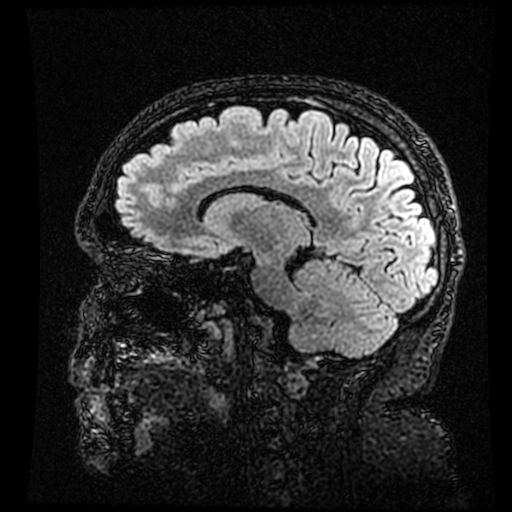
[im 220/320]
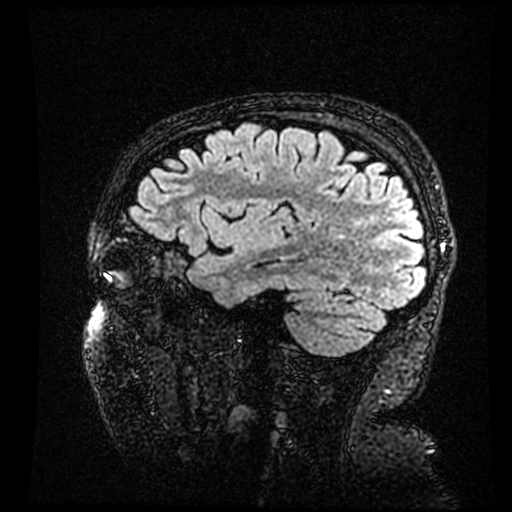
[im 260/320]
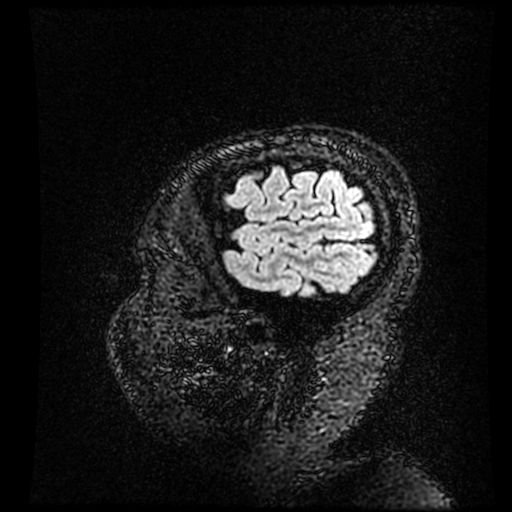
[im 280/320]
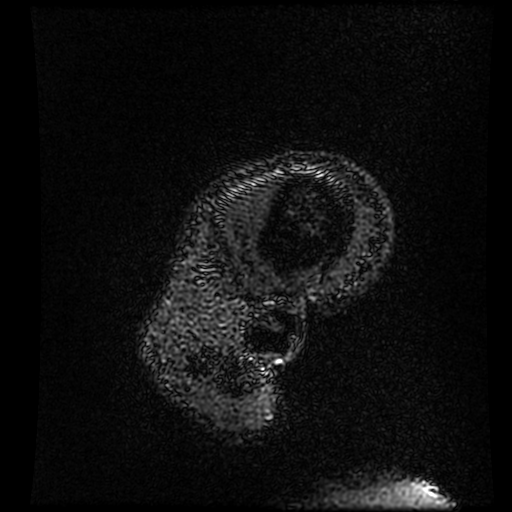
[im 300/320]
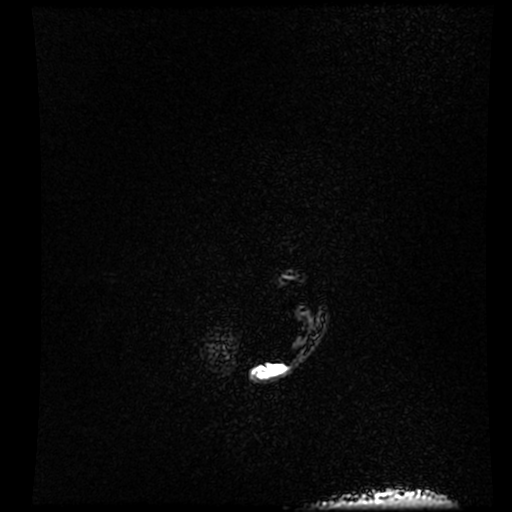

[Series 300: DWI · axial · 3.0mm · 1.09mm/px · z∈[-77,+76]mm · 3 of 52 slices shown (3 of 4)]
[im 1/52]
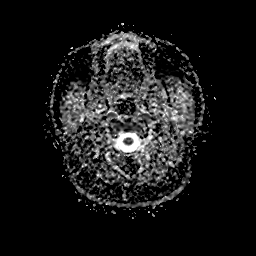
[im 26/52]
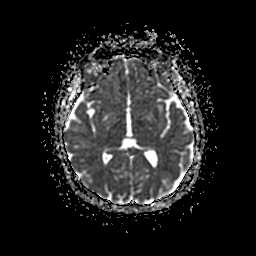
[im 52/52]
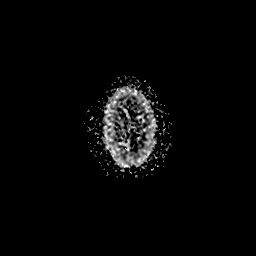

[Series 400: DWI · coronal · 5.0mm · 1.09mm/px · 2 of 37 slices shown (4 of 4)]
[im 1/37]
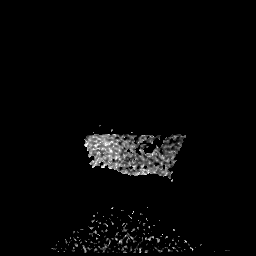
[im 37/37]
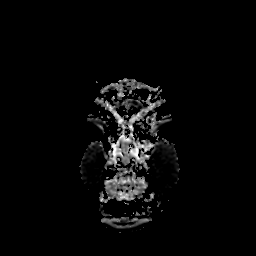

[29 of 48 positions shown; findings below may reference images not displayed]

FINDINGS: Brain:

Cerebral volume is normal.

There are several (at least 7) small scattered foci of T2 FLAIR
hyperintense signal abnormality within the bilateral cerebral white
matter, an unexpected finding in a patient of this age. These foci
measure up to 3 mm.

9 mm pineal cyst.

No cortical encephalomalacia.

There is no acute infarct.

No evidence of an intracranial mass.

No chronic intracranial blood products.

No extra-axial fluid collection.

No midline shift.

Vascular: Maintained flow voids within the proximal large arterial
vessels.

Skull and upper cervical spine: Indeterminate 8 mm T1 hypointense
and subtly T2 FLAIR hyperintense lesion within the C4 vertebral body
(series 5, image 12) (series 0225, image 162).

Sinuses/Orbits: Visualized orbits show no acute finding. Small
mucous retention cysts within the bilateral maxillary sinuses.

Impression #6 will be called to the ordering clinician or
representative by the Radiologist Assistant, and communication
documented in the PACS or [REDACTED].
IMPRESSION: There are several (at least 7) small nonspecific T2 FLAIR
hyperintense remote insults scattered within bilateral cerebral
white matter, an unexpected finding in a patient of this age. These
foci measure up to 3 mm.

9 mm pineal cyst.

Otherwise unremarkable MRI appearance of the brain.

If symptoms of tinnitus persists, and internal auditory canal
protocol brain MRI with contrast may be helpful for further
evaluation.

Small mucous retention cysts within the bilateral maxillary sinuses.

8 mm T1 hypointense and subtly T2 FLAIR hyperintense lesion within
the C4 vertebral body. This may reflect a benign lesion (such as
atypical hemangioma). However, the imaging features are nonspecific
and this lesion remains indeterminate in etiology. Consider a short
interval 3-4 month follow-up cervical spine MRI to ensure stability.

## 2022-08-19 ENCOUNTER — Telehealth: Payer: Self-pay | Admitting: Pharmacy Technician

## 2022-08-19 ENCOUNTER — Other Ambulatory Visit (HOSPITAL_COMMUNITY): Payer: Self-pay

## 2022-08-19 NOTE — Telephone Encounter (Signed)
Patient Advocate Encounter  Received notification from [facility] that prior authorization for UBRELVY  is required.   PA submitted on 4.17.24 Key [id number] Status is pending

## 2022-08-19 NOTE — Telephone Encounter (Signed)
ERROR

## 2022-08-19 NOTE — Telephone Encounter (Signed)
Patient Advocate Encounter  Prior Authorization for UBRELVY  has been approved.    PA#  [id number] Effective dates: 4.17.24 through 4.17.25

## 2022-11-03 ENCOUNTER — Other Ambulatory Visit: Payer: Self-pay | Admitting: Neurology

## 2022-11-18 IMAGING — MR MR CERVICAL SPINE W/O CM
5 series · 41 of 48 positions shown · non-contrast
Comparison: MRI of the brain May 14, 2021.

CLINICAL DATA: Complete lesion at C4 level of cervical spinal cord,
initial encounter (HCC) 2ND.NNDM (5LC-RW-CM)

EXAM:
MRI CERVICAL SPINE WITHOUT CONTRAST
TECHNIQUE: Multiplanar, multisequence MR imaging of the cervical spine was
performed. No intravenous contrast was administered.

[Series 5: T2 · sagittal · 3.0mm · 0.69mm/px · 6 of 15 slices shown (1 of 2)]
[im 1/15]
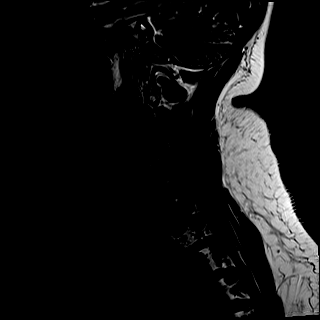
[im 3/15]
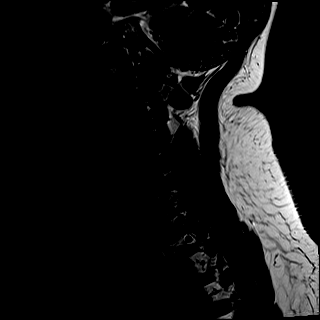
[im 6/15]
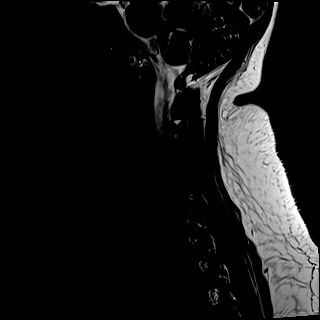
[im 9/15]
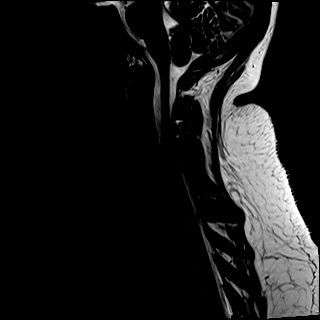
[im 12/15]
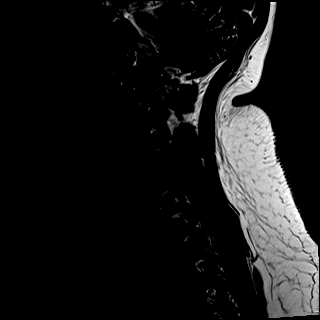
[im 15/15]
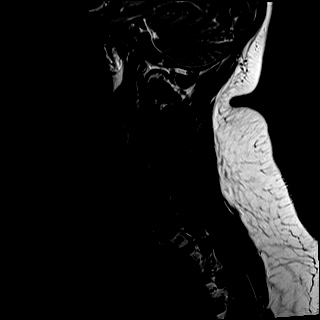

[Series 6: T1 · sagittal · 3.0mm · 0.86mm/px · 6 of 15 slices shown]
[im 1/15]
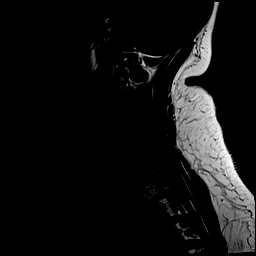
[im 3/15]
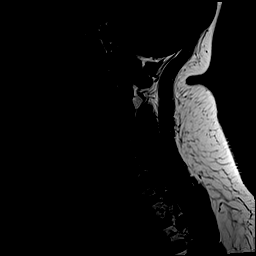
[im 6/15]
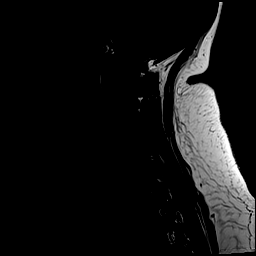
[im 9/15]
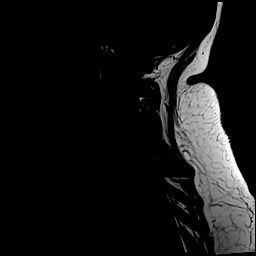
[im 12/15]
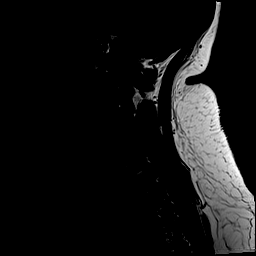
[im 15/15]
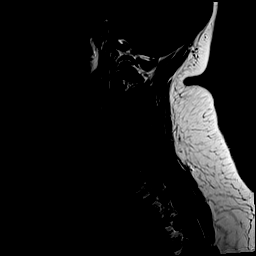

[Series 7: STIR · sagittal · 3.0mm · 0.69mm/px · 6 of 15 slices shown]
[im 1/15]
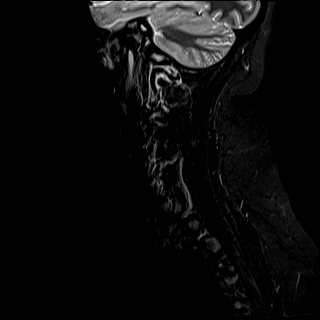
[im 3/15]
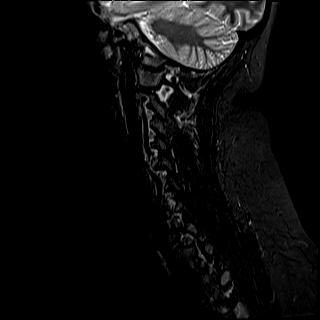
[im 6/15]
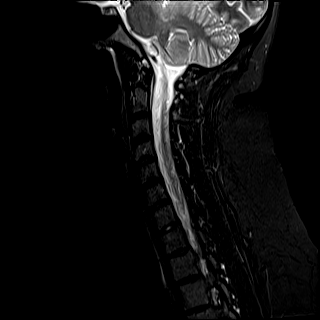
[im 9/15]
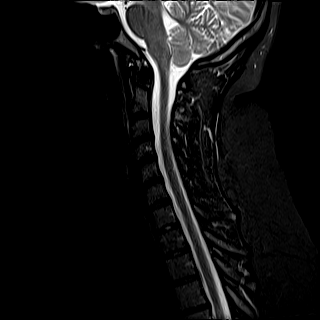
[im 12/15]
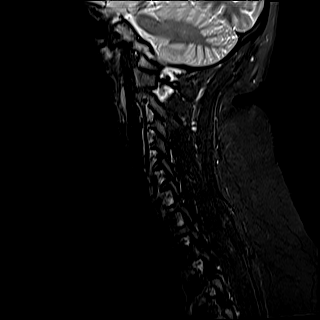
[im 15/15]
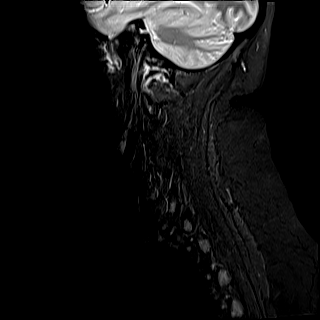

[Series 8: T2 · axial · 3.0mm · 0.70mm/px · z∈[+20,+152]mm · 15 of 40 slices shown (2 of 2)]
[im 1/40]
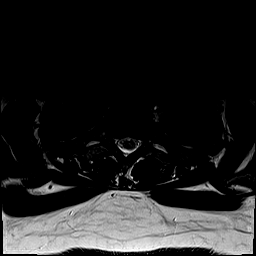
[im 3/40]
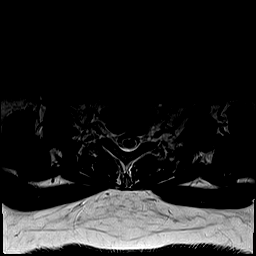
[im 6/40]
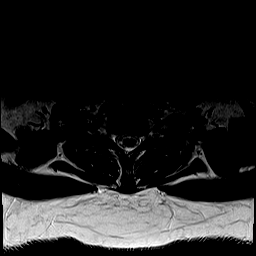
[im 9/40]
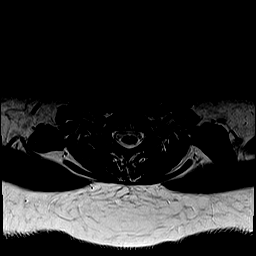
[im 12/40]
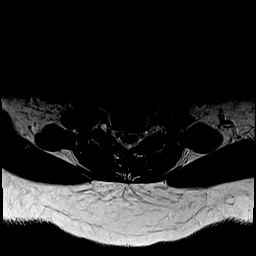
[im 14/40]
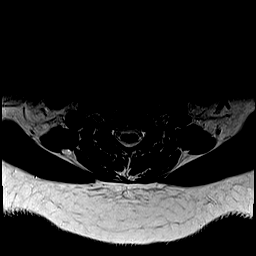
[im 17/40]
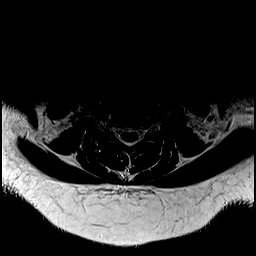
[im 20/40]
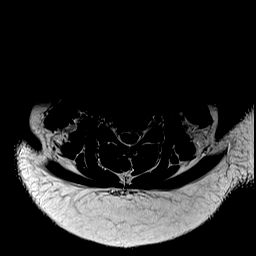
[im 23/40]
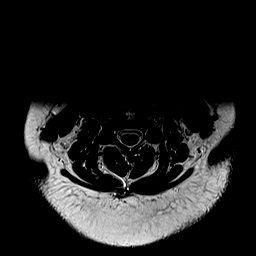
[im 26/40]
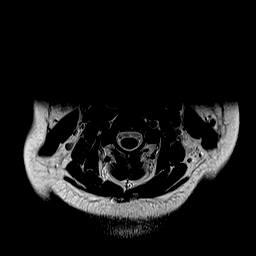
[im 28/40]
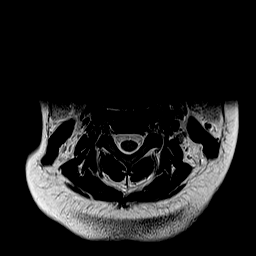
[im 31/40]
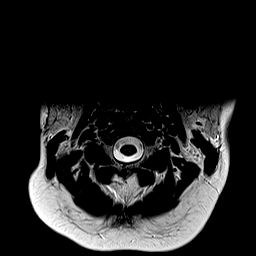
[im 34/40]
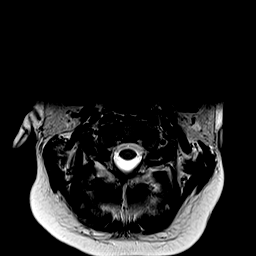
[im 37/40]
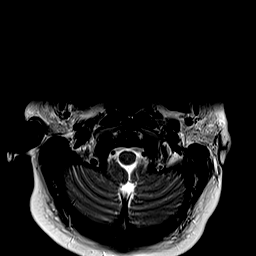
[im 40/40]
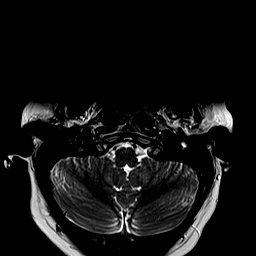

[Series 9: GRE · axial · 3.0mm · 0.35mm/px · z∈[+20,+152]mm · 8 of 40 slices shown]
[im 1/40]
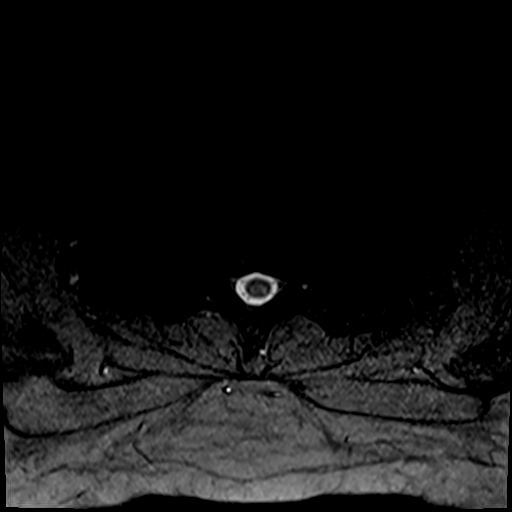
[im 6/40]
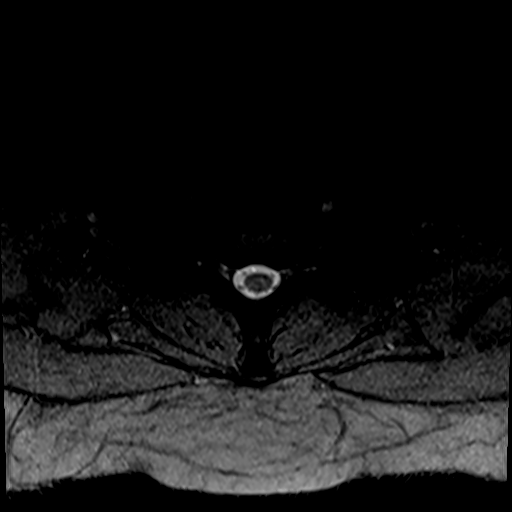
[im 12/40]
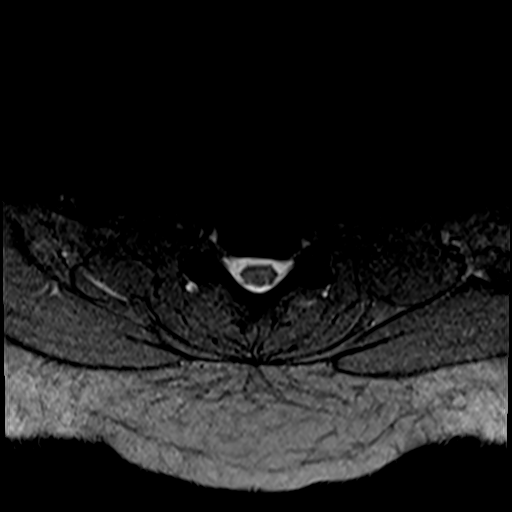
[im 17/40]
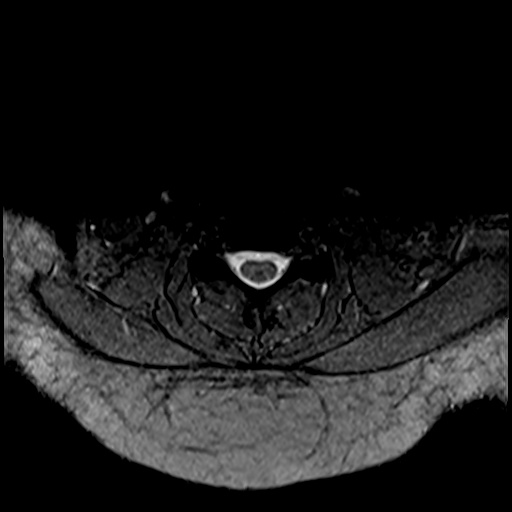
[im 23/40]
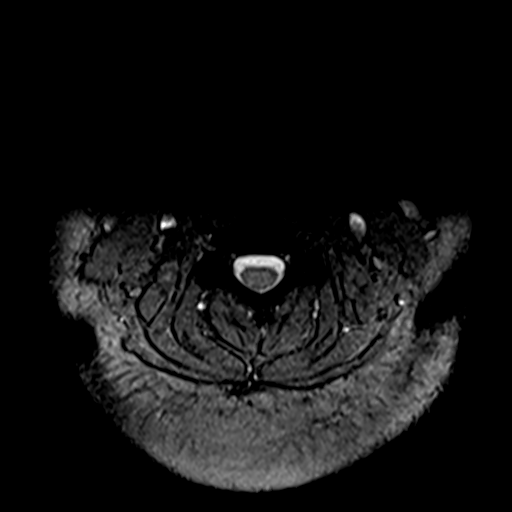
[im 28/40]
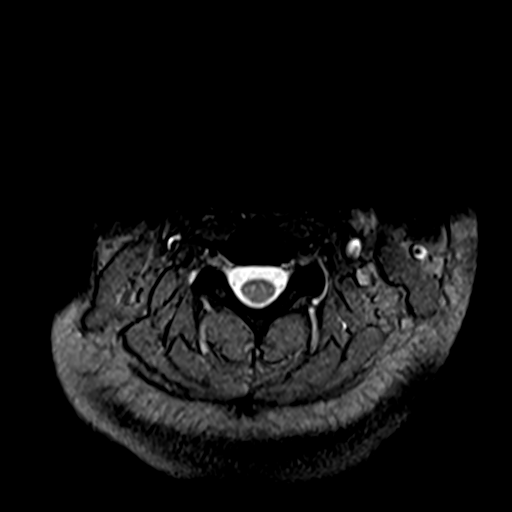
[im 34/40]
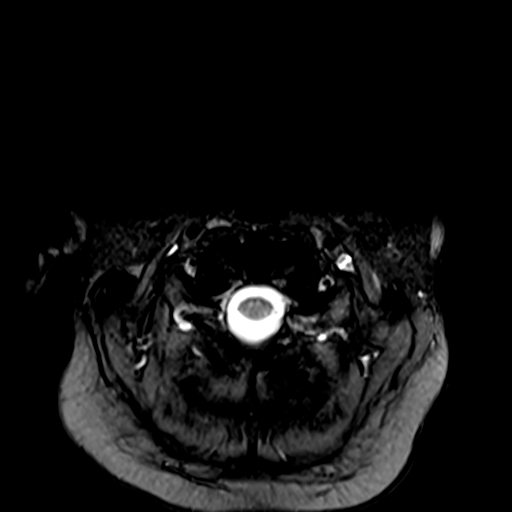
[im 40/40]
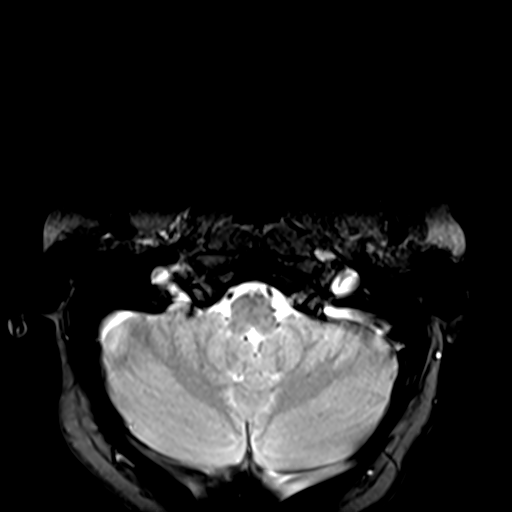

[41 of 48 positions shown; findings below may reference images not displayed]

FINDINGS: Alignment: Straightening of the cervical curvature.

Vertebrae: No interval change of the T1 hypointense lesion in the C4
vertebral body with hyperintense signal on T2 with a "salt and
pepper" appearance on T2 axial (series 8, image 19) suggestive of
thickened trabeculated, commonly seen in hemangiomas. No associated
soft tissue component. No fracture or evidence of discitis.

Cord: Normal signal and morphology.

Posterior Fossa, vertebral arteries, paraspinal tissues: Negative.

Disc levels:

Mild facet degenerative changes at C2-3.

No significant disc bulge or herniation, spinal canal or neural
foraminal stenosis at any level.
IMPRESSION: C4 vertebral body lesion is suggestive of an atypical hemangioma and
is stable when compared to prior MRI.

## 2022-11-24 IMAGING — CR DG LUMBAR SPINE 2-3V
3 series · 3 of 3 positions shown · non-contrast
Comparison: None.

CLINICAL DATA: Back pain

EXAM:
LUMBAR SPINE - 2-3 VIEW

[t l-spine a.p. *]
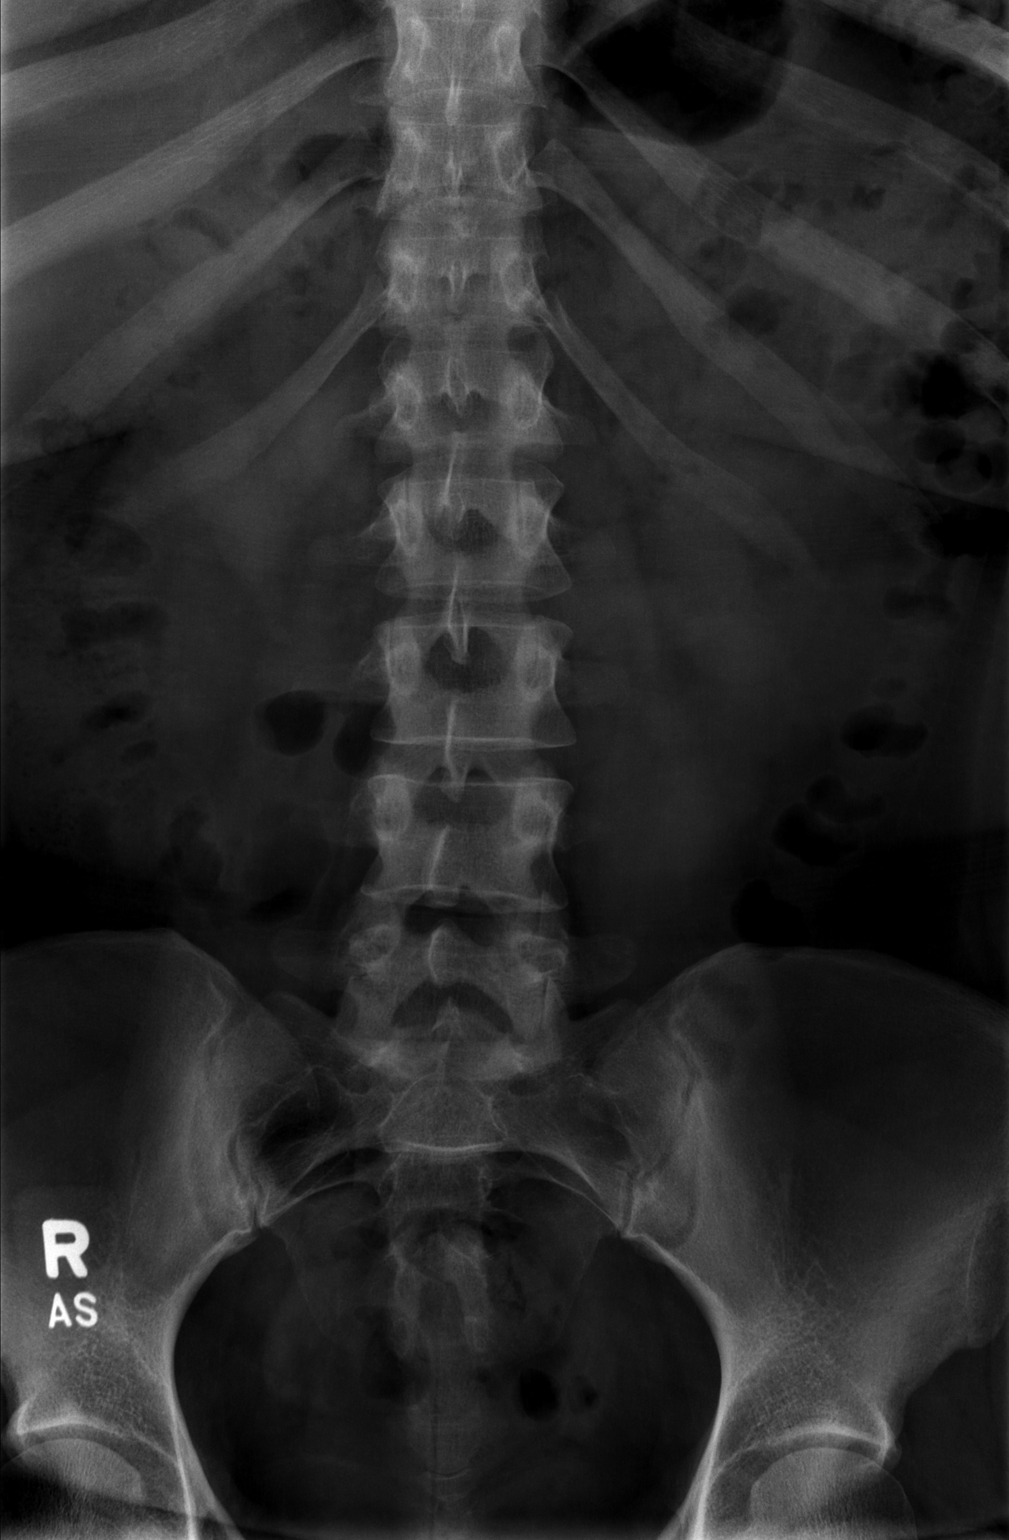

[t l-spine lat *]
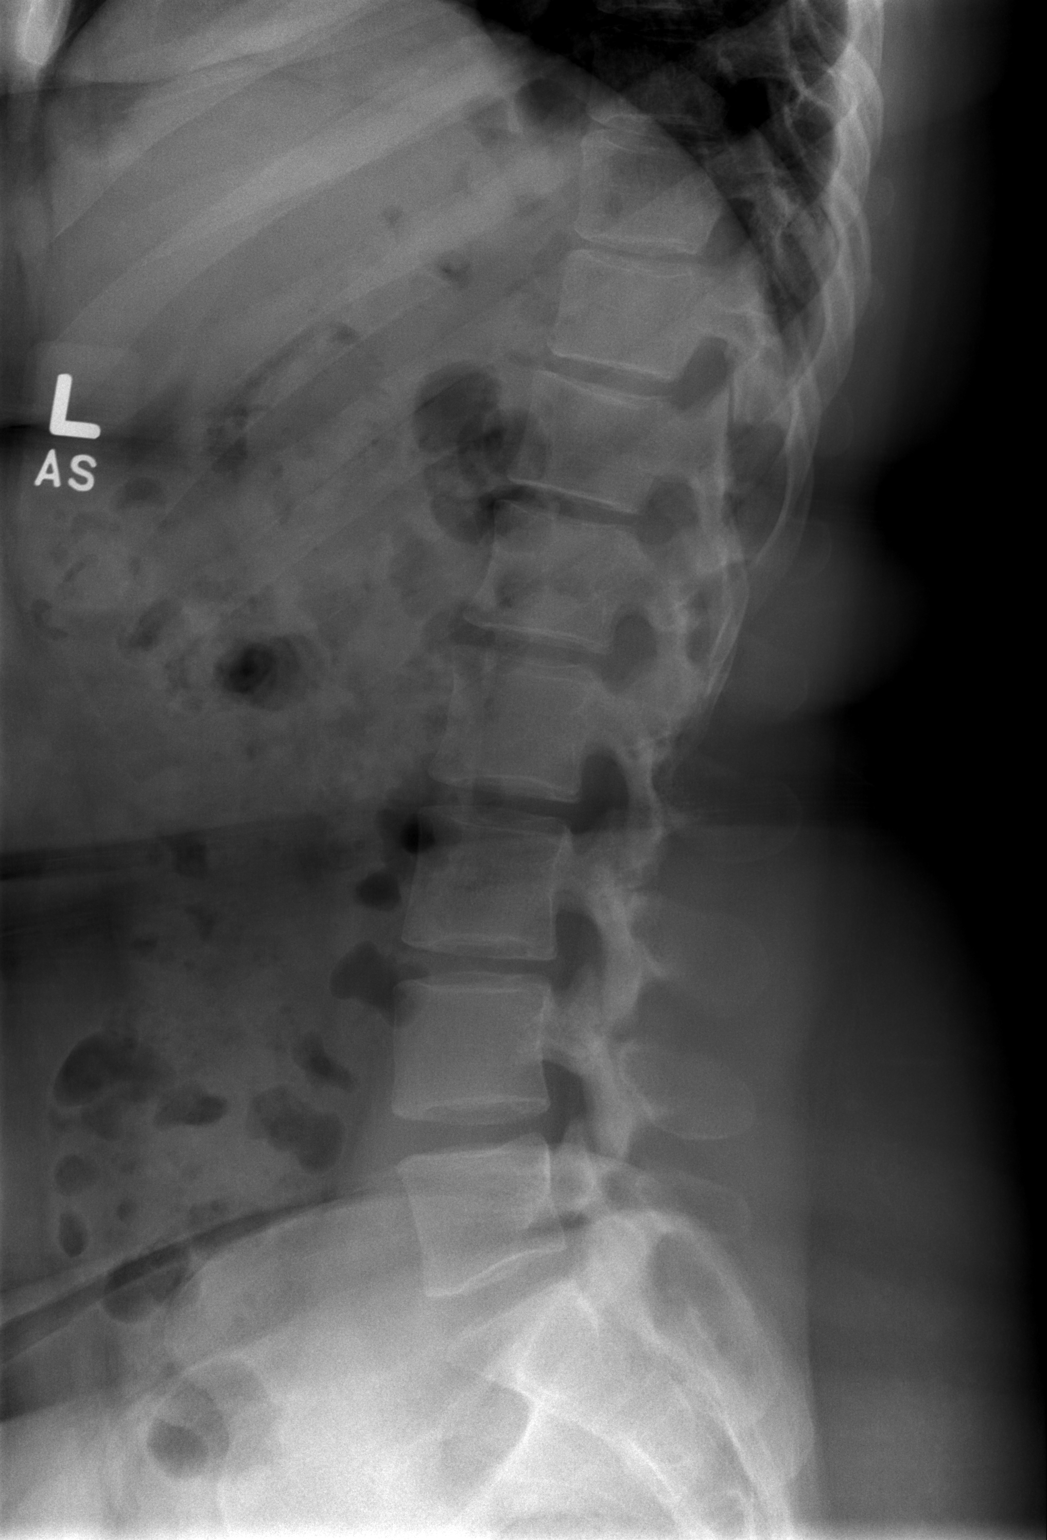

[t l-spine l5-s1 spot]
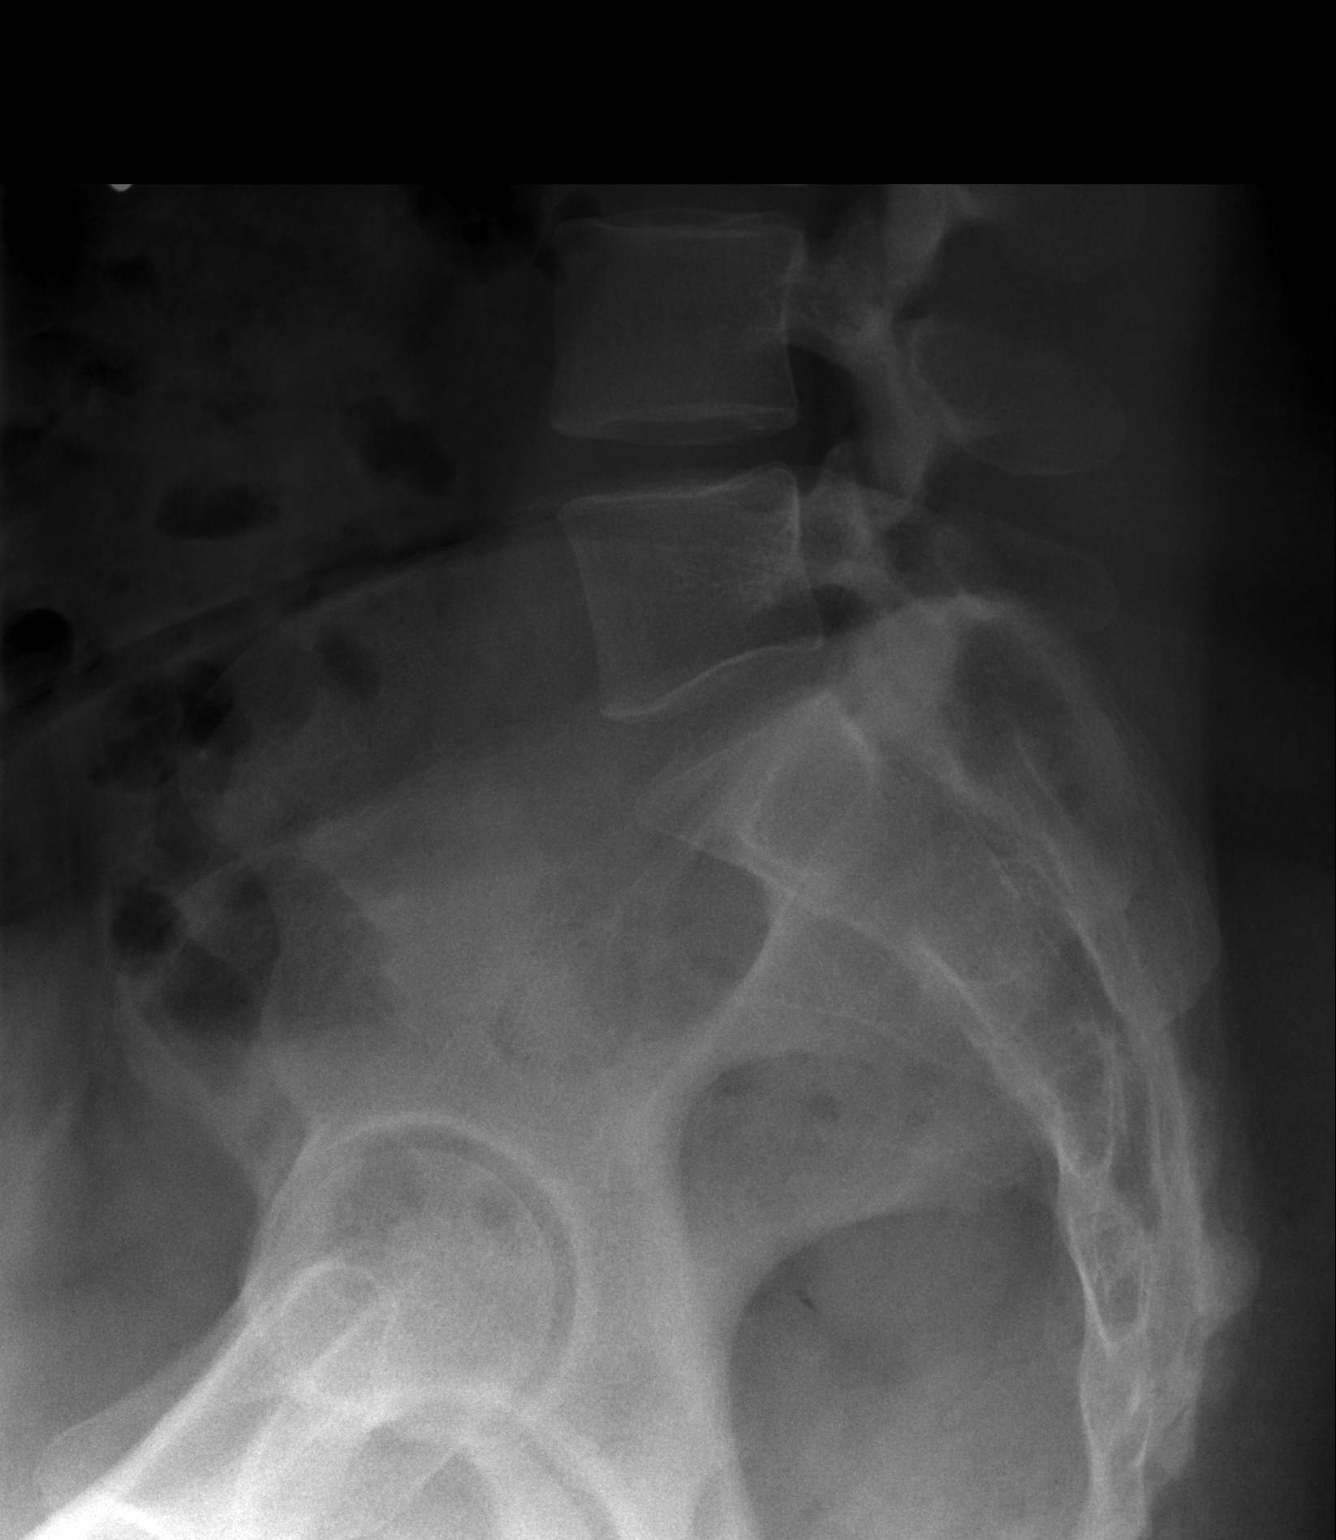

[3 of 3 positions shown; findings below may reference images not displayed]

FINDINGS: There is no evidence of lumbar spine fracture. Alignment is normal.
Intervertebral disc spaces are maintained.
IMPRESSION: Negative.

## 2022-11-24 IMAGING — CR DG THORACIC SPINE 3V
3 series · 3 of 3 positions shown · non-contrast
Comparison: None.

CLINICAL DATA: Back pain

EXAM:
THORACIC SPINE - 3 VIEWS

[t t-spine a.p. *]
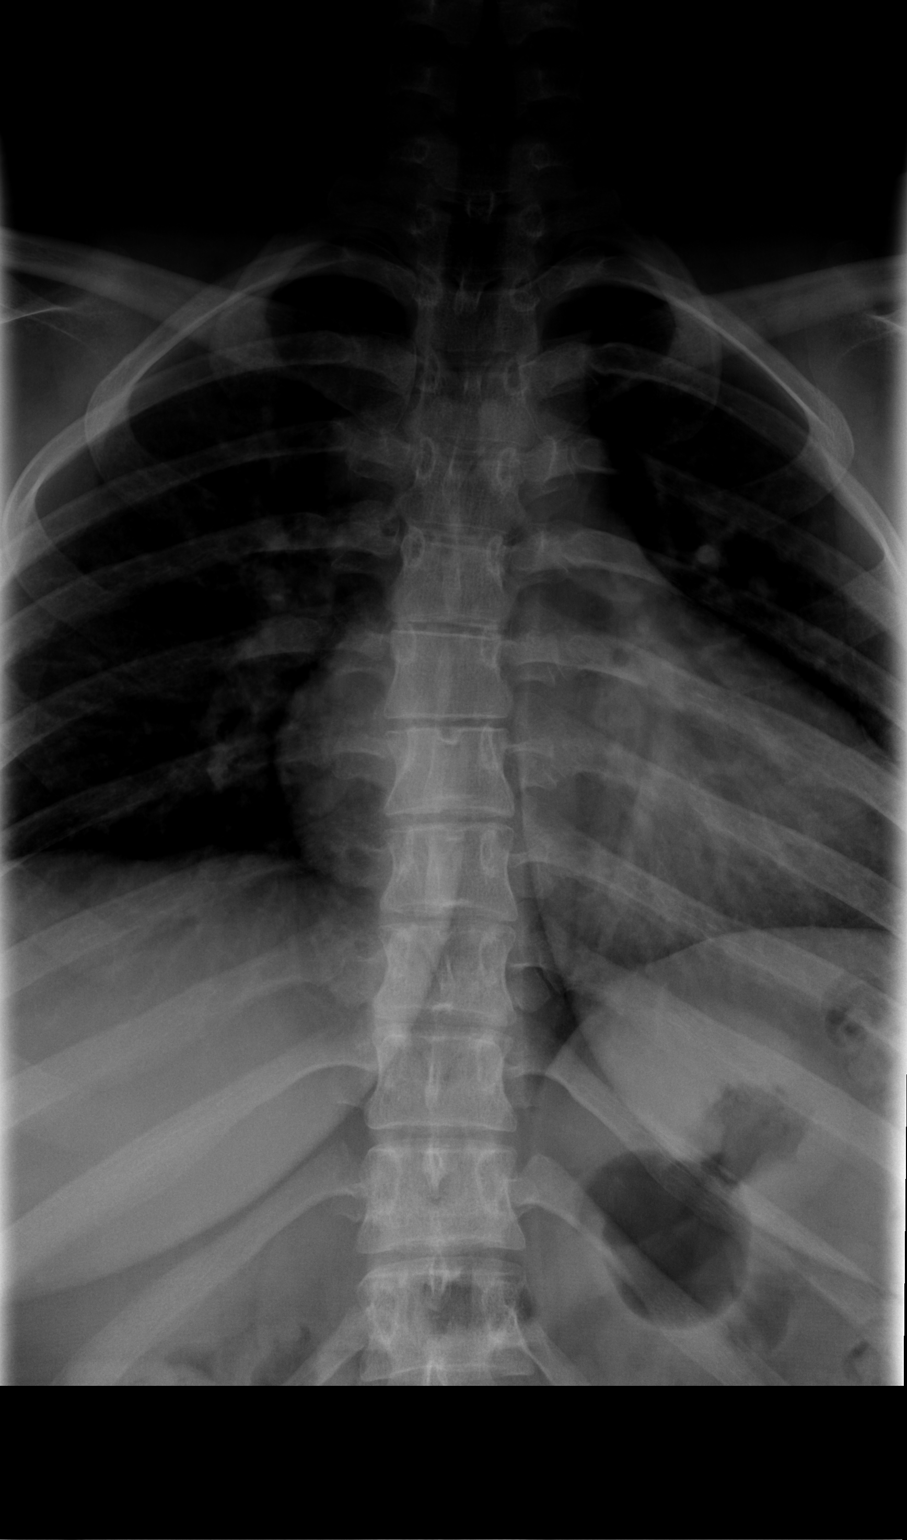

[t t-spine lat *]
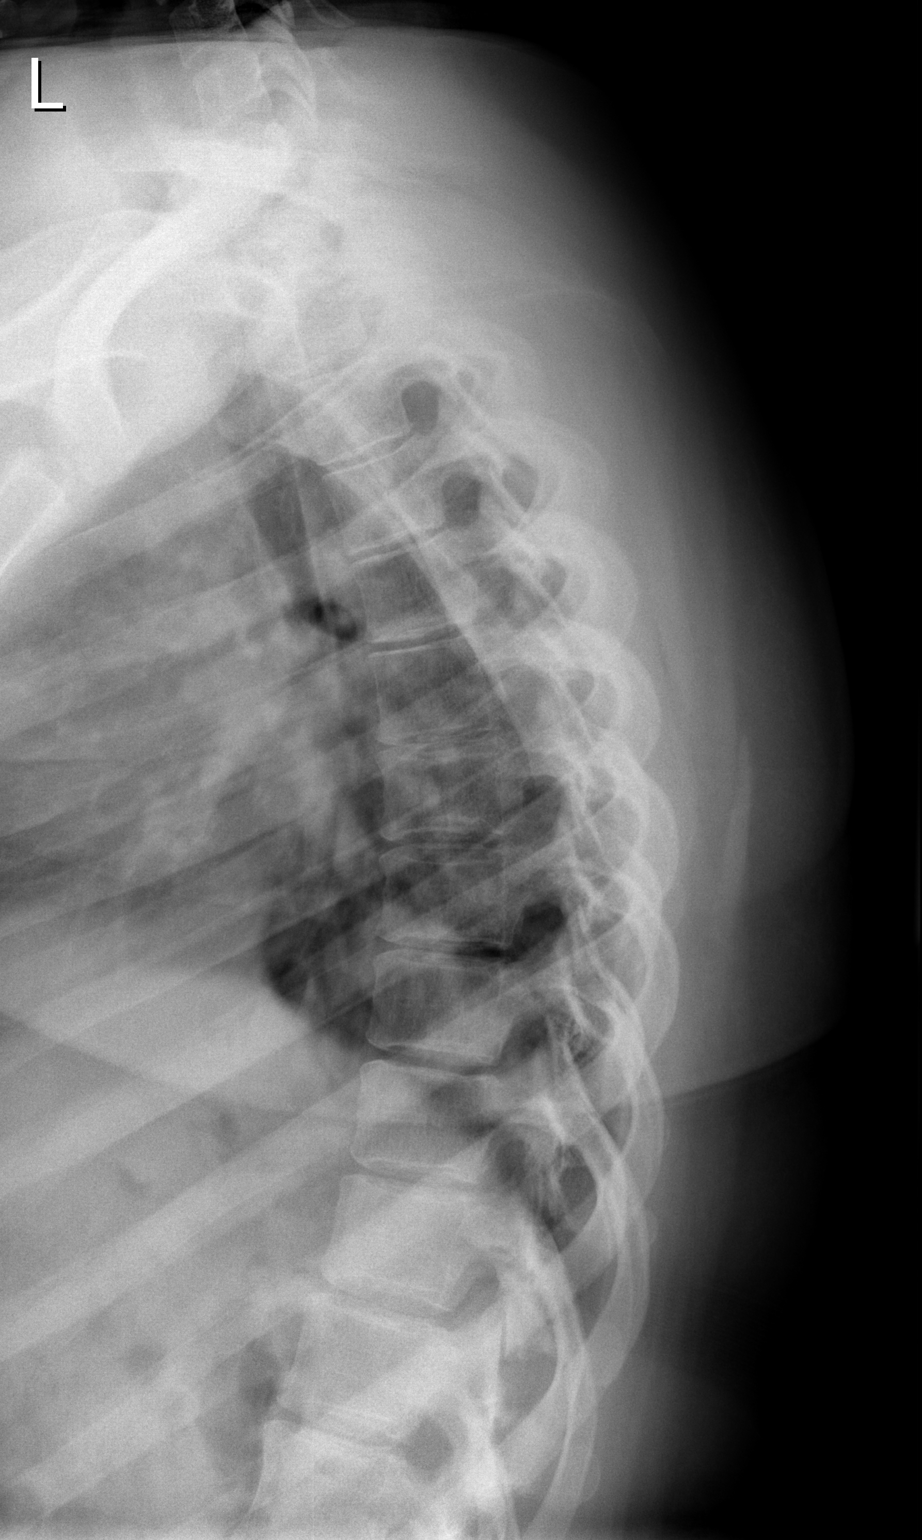

[t swimmers *]
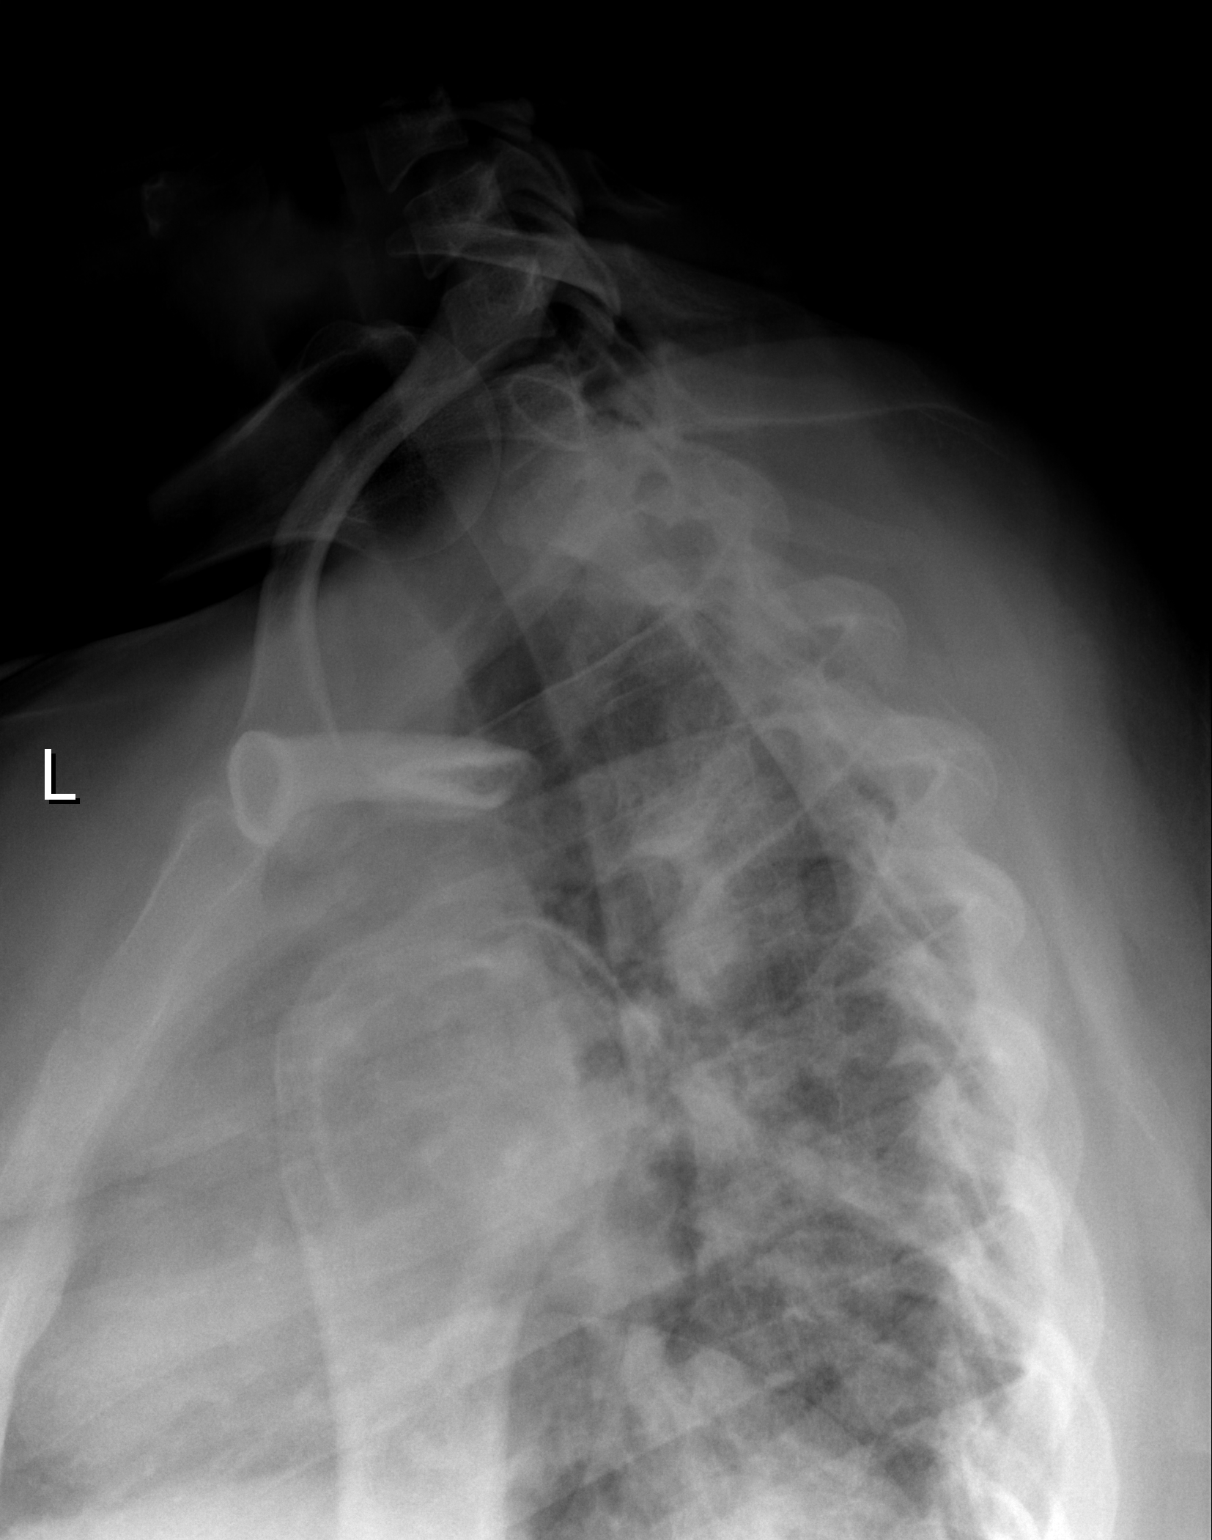

[3 of 3 positions shown; findings below may reference images not displayed]

FINDINGS: There is no evidence of thoracic spine fracture. Alignment is
normal. No other significant bone abnormalities are identified.
IMPRESSION: Negative.

## 2022-11-25 NOTE — Progress Notes (Signed)
NEUROLOGY FOLLOW UP OFFICE NOTE  Shelly Carpenter [id number]  Assessment/Plan:   Migraine without aura, without status migrainosus, not intractable  Spinal pain - unclear etiology.  Not consistent with radiculopathy but not classic presentation for muscular or arthritic.    Migraine prevention:  Nortriptyline 10mg  at bedtime; gabapentin 300mg  twice daily (neck/back pain) Migraine rescue:  Ubrelvy 100mg  Limit use of pain relievers to no more than 2 days out of week to prevent risk of rebound or medication-overuse headache. Keep headache diary Follow up 6 months       Subjective:  Shelly Carpenter is a 27 year old right-handed female with migraines and PCOS who follows up for migraine.   UPDATE: Tapered off venlafaxine and started nortriptyline.   She is feeling better. Intensity:  severe Duration:  1 hour with [name] Frequency:  4 days a month    Current NSAIDS/analgesics:  Tylenol Current triptans: none Current ergotamine:  none Current anti-emetic:  none Current muscle relaxants:  none Current Antihypertensive medications:  none Current Antidepressant medications:  nortriptyline 10mg  at bedtime Current Anticonvulsant medications:  gabapentin 300mg  BID (for cervical/back pain) Current anti-CGRP:  Ubrelvy 100mg  Current Vitamins/Herbal/Supplements:  none Current Antihistamines/Decongestants:  none Other therapy:  none Current birth control:  Micronor   Caffeine:  rarely coffee; no soda Diet:  improved.  Increased water intake Exercise:  not since flare of spinal pain Other pain:  neck/back pain     HISTORY:  Since early 2022, she has had headaches and dizziness. - Dizziness described as spinning and lightheadedness, sometimes feeling like she will pass out but hasn't.  Associated nausea.  Lasts at least 30-45 seconds but not necessarily with movement.  This occurs once every 2 months. - Bilateral tinnitus (ringing). - Headaches are moderate to severe posterior  headaches, pressure like and sharp in quality with associated posterior neck pain, nausea, photophobia, phonophobia and blurred vision.  Still able to function.  They were initially occurring 3-4 times a week.  She started topiramate and frequency decreased to 2-3 times a month.  They last 2 hours with Tylenol but all day untreated. However, in February 2023, she developed midline spinal pain from the neck down to lumbar region.  Arms and legs may shake but no radicular pain or weakness of extremities.  Denies injury, strenuous activity or other precipitating event.  Constant and not aggravated by movement or prolonged inactivity.  This pain has increased headache frequency to 2 times a week.   She was prescribed anti-inflammatories and muscle relaxers (unsure of name) with no improvement.  She tried a family member's gabapentin (300mg  twice daily) which helped.  In early January 2023, she had one particular headache that was severe and associated with black and white out of vision.  She needed to leave work.  It responded quickly (within 1 hour) to Nurtec but her insurance wouldn't approve it.     Reports history of headaches as a child that became infrequent by college, a mild diffuse pressure headache without associated symptoms.   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  Follow up MRI of cervical spine on 08/22/2021 confirmed hemangioma and no spinal cord abnormalities.  She had an eye exam that was unremarkable.  X-rays of thoracic and lumbar spine on 08/28/2021 were negative.   No family  history of migraines  Past triptan:  sumatriptan tab, rizatriptan Past antidepressant:  venlafaxine  Past antiepileptic:  topiramate.  PAST MEDICAL HISTORY: Past Medical History:  Diagnosis Date   PCOS  (polycystic ovarian syndrome)    Polycystic disease, ovaries     MEDICATIONS: Current Outpatient Medications on File Prior to Visit  Medication Sig Dispense Refill   gabapentin (NEURONTIN) 300 MG capsule Take 1 capsule (300 mg total) by mouth 2 (two) times daily. 60 capsule 5   metFORMIN (GLUCOPHAGE) 500 MG tablet Take by mouth.     norethindrone (MICRONOR) 0.35 MG tablet Take 1 tablet by mouth daily.     nortriptyline (PAMELOR) 10 MG capsule TAKE 1 CAPSULE BY MOUTH EVERYDAY AT BEDTIME 90 capsule 0   Ubrogepant (UBRELVY) 100 MG TABS Take 1 tablet (100 mg total) by mouth as needed. May repeat in 2 hours.  Maximum 2 tablets in 24 hours. 10 tablet 5   venlafaxine XR (EFFEXOR XR) 37.5 MG 24 hr capsule Take 1 capsule (37.5 mg total) by mouth daily with breakfast. 7 capsule 0   No current facility-administered medications on file prior to visit.    ALLERGIES: No Known Allergies  FAMILY HISTORY: No family history on file.    Objective:  Blood pressure 130/86, pulse 84, height 5\' 3"  (1.6 m), weight 265 lb (120.2 kg), SpO2 98%. General: No acute distress.  Patient appears well-groomed.   Head:  Normocephalic/atraumatic   Shon Millet, DO  CC: [name], PA-C

## 2022-11-26 ENCOUNTER — Encounter: Payer: Self-pay | Admitting: Neurology

## 2022-11-26 ENCOUNTER — Ambulatory Visit (INDEPENDENT_AMBULATORY_CARE_PROVIDER_SITE_OTHER): Payer: Commercial Managed Care - PPO | Admitting: Neurology

## 2022-11-26 VITALS — BP 130/86 | HR 84 | Ht 63.0 in | Wt 265.0 lb

## 2022-11-26 DIAGNOSIS — G43009 Migraine without aura, not intractable, without status migrainosus: Secondary | ICD-10-CM

## 2022-11-26 MED ORDER — NORTRIPTYLINE HCL 10 MG PO CAPS: 10.0000 mg | ORAL_CAPSULE | Freq: Every day | ORAL | 3 refills | Status: DC

## 2022-11-26 NOTE — Patient Instructions (Signed)
Nortriptyline 10mg  at bedtime [name] as needed

## 2023-01-29 ENCOUNTER — Telehealth: Payer: Self-pay | Admitting: Neurology

## 2023-01-29 NOTE — Telephone Encounter (Signed)
Pt just found out that she is pregnant and her OB does not want there to take the Nortriptyline. She needs to speak to someone about this

## 2023-01-29 NOTE — Telephone Encounter (Signed)
Patient advised Dr.Jaffe is out of the office til Monday.

## 2023-01-29 NOTE — Telephone Encounter (Signed)
She is on a low dose, can stop nortriptyline. Options are limited during pregnancy. Daily magnesium 400mg  and riboflavin (Vitamin B2) 400mg  can help with headache reduction also.

## 2023-01-29 NOTE — Telephone Encounter (Signed)
LMOVM for patient to call the office back.

## 2023-03-10 LAB — OB RESULTS CONSOLE RUBELLA ANTIBODY, IGM
Rubella: IMMUNE
Rubella: NON-IMMUNE/NOT IMMUNE

## 2023-03-10 LAB — OB RESULTS CONSOLE RPR: RPR: NONREACTIVE

## 2023-03-10 LAB — OB RESULTS CONSOLE HEPATITIS B SURFACE ANTIGEN: Hepatitis B Surface Ag: NEGATIVE

## 2023-03-10 LAB — OB RESULTS CONSOLE GC/CHLAMYDIA
Chlamydia: NEGATIVE
Neisseria Gonorrhea: NEGATIVE

## 2023-03-10 LAB — HEPATITIS C ANTIBODY: HCV Ab: NEGATIVE

## 2023-03-10 LAB — OB RESULTS CONSOLE HIV ANTIBODY (ROUTINE TESTING): HIV: NONREACTIVE

## 2023-04-08 ENCOUNTER — Encounter: Payer: Self-pay | Admitting: Podiatry

## 2023-04-08 ENCOUNTER — Ambulatory Visit: Payer: Commercial Managed Care - PPO | Admitting: Podiatry

## 2023-04-08 VITALS — Ht 63.0 in | Wt 265.0 lb

## 2023-04-08 DIAGNOSIS — M2042 Other hammer toe(s) (acquired), left foot: Secondary | ICD-10-CM | POA: Diagnosis not present

## 2023-04-08 DIAGNOSIS — B351 Tinea unguium: Secondary | ICD-10-CM | POA: Diagnosis not present

## 2023-04-08 NOTE — Progress Notes (Signed)
Subjective:   Patient ID: Shelly Carpenter, female   DOB: 27 y.o.   MRN: [id number]   HPI Patient presents stating that she is concerned about thickness discoloration of nailbed right big toe and left second nail.  States that she may have traumatized it is been going on for around 9 months and she did do oral antifungal therapy which has not made a difference.  Patient is currently pregnant due in June   Review of Systems  All other systems reviewed and are negative.       Objective:  Physical Exam Vitals and nursing note reviewed.  Constitutional:      Appearance: She is well-developed.  Pulmonary:     Effort: Pulmonary effort is normal.  Musculoskeletal:        General: Normal range of motion.  Skin:    General: Skin is warm.  Neurological:     Mental Status: She is alert.     Neurovascular status found to be intact muscle strength found to be adequate range of motion within normal limits.  Patient is noted to have a thickened dystrophic toenail right big toe and left second nailbed with digital deformities of the lesser toes left over right.  Family history with grandmother having structural deformity with moderate bunion deformity noted along with the digital deformities     Assessment:  Nail disease with the probability of trauma over fungus given lack of response to medication along with digital deformities mild bunion which can also be part of the pathology     Plan:  H&P all conditions reviewed and I discussed the continuation of trimming techniques and if they start to become painful that nail removal may be necessary in future.  Do not recommend addressing digital deformity bunion at this time but good supportive shoes recommended

## 2023-05-05 NOTE — L&D Delivery Note (Signed)
 Delivery Note At 2:51 PM a viable and healthy female was delivered via Vaginal, Spontaneous (Presentation: Right Occiput Anterior).  APGAR: 8, 9; weight pending .   Placenta status: Spontaneous;Expressed, Intact.  Cord: 3 vessels with the following complications: None.  Cord pH: n/a  Anesthesia: Epidural Episiotomy: None Lacerations: 2nd degree Suture Repair: 3.0 vicryl Est. Blood Loss (mL):  350 cc  Mom to postpartum.  Baby to Couplet care / Skin to Skin.  Shelly Carpenter A Ebonie Westerlund 09/28/2023, 3:19 PM

## 2023-05-12 ENCOUNTER — Encounter: Payer: Commercial Managed Care - PPO | Attending: Obstetrics and Gynecology | Admitting: Dietician

## 2023-05-12 DIAGNOSIS — O99810 Abnormal glucose complicating pregnancy: Secondary | ICD-10-CM | POA: Insufficient documentation

## 2023-05-12 NOTE — Progress Notes (Signed)
 Patient was seen on 05/11/2022 for Gestational Diabetes self-management class at the [facility]. The following learning objectives were met by the patient during this course:  States the definition of Gestational Diabetes States why dietary management is important in controlling blood glucose Describes the effects each nutrient has on blood glucose levels Demonstrates ability to create a balanced meal plan Demonstrates carbohydrate counting  States when to check blood glucose levels Demonstrates proper blood glucose monitoring techniques States the effect of stress and exercise on blood glucose levels States the importance of limiting caffeine and abstaining from alcohol and smoking  Blood glucose monitor: Pt present with Accu Chek glucometer Fasting: 89, 92, 103, 101 mg/dL  2 hour post prandial glucose: 84, 106, 114, 144 mg/dL    Patient instructed to monitor glucose levels: QID FBS: 60 - <90 1 hour: <140 2 hour: <120  *Patient received handouts: Nutrition Diabetes and Pregnancy Carbohydrate Counting List Blood glucose log Snack ideas for diabetes during pregnancy  Patient will be seen for follow-up as needed.

## 2023-06-02 NOTE — Progress Notes (Unsigned)
NEUROLOGY FOLLOW UP OFFICE NOTE  Shelly Carpenter [id number]  Assessment/Plan:   Migraine without aura, without status migrainosus, not intractable  Spinal pain - unclear etiology.  Not consistent with radiculopathy but not classic presentation for muscular or arthritic.    Migraine prevention:  deferred.  If she decides to breastfeed, we can try propranolol or restart nortriptyline (pending approval from the pediatrician) Migraine rescue:  Tylenol.  If she decides to breastfeed, we can start a triptan but would not be able to restart Ubrelvy Limit use of pain relievers to no more than 2 days out of week to prevent risk of rebound or medication-overuse headache. Keep headache diary Follow up 6 months       Subjective:  Shelly Carpenter is a 28 year old right-handed female with migraines and PCOS who follows up for migraine.   UPDATE: In September, she found out she was pregnant.  Discontinued medications.  She is due on June 1.  At this time, she is uncertain if she will breastfeed.  Migraines have been well-controlled.  She has had about 2 since September.    Current NSAIDS/analgesics:  Tylenol Current triptans: none Current ergotamine:  none Current anti-emetic:  none Current muscle relaxants:  none Current Antihypertensive medications:  none Current Antidepressant medications:  none Current Anticonvulsant medications:  none Current anti-CGRP:  none Current Vitamins/Herbal/Supplements:  none Current Antihistamines/Decongestants:  none Other therapy:  none Current birth control:  Micronor     HISTORY:  Since early 2022, she has had headaches and dizziness. - Dizziness described as spinning and lightheadedness, sometimes feeling like she will pass out but hasn't.  Associated nausea.  Lasts at least 30-45 seconds but not necessarily with movement.  This occurs once every 2 months. - Bilateral tinnitus (ringing). - Headaches are moderate to severe posterior headaches,  pressure like and sharp in quality with associated posterior neck pain, nausea, photophobia, phonophobia and blurred vision.  Still able to function.  They were initially occurring 3-4 times a week.  She started topiramate and frequency decreased to 2-3 times a month.  They last 2 hours with Tylenol but all day untreated. However, in February 2023, she developed midline spinal pain from the neck down to lumbar region.  Arms and legs may shake but no radicular pain or weakness of extremities.  Denies injury, strenuous activity or other precipitating event.  Constant and not aggravated by movement or prolonged inactivity.  This pain has increased headache frequency to 2 times a week.   She was prescribed anti-inflammatories and muscle relaxers (unsure of name) with no improvement.  She tried a family member's gabapentin (300mg  twice daily) which helped.  In early January 2023, she had one particular headache that was severe and associated with black and white out of vision.  She needed to leave work.  It responded quickly (within 1 hour) to Nurtec but her insurance wouldn't approve it.     Reports history of headaches as a child that became infrequent by college, a mild diffuse pressure headache without associated symptoms.   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  Follow up MRI of cervical spine on 08/22/2021 confirmed hemangioma and no spinal cord abnormalities.  She had an eye exam that was unremarkable.  X-rays of thoracic and lumbar spine on 08/28/2021 were  negative.   No family history of migraines  Past triptan:  sumatriptan tab, rizatriptan Past antidepressant:  venlafaxine, nortriptyline 10mg  (effective - stopped due to pregnancy) Past antiepileptic:  topiramate, gabapentin 300mg  BID (chronic  neck/back pain - stopped due to pregnancy) Past CGRP inhibitor:  Ubrelvy (effective- stopped due to pregnancy).  PAST MEDICAL HISTORY: Past Medical History:  Diagnosis Date   PCOS (polycystic ovarian syndrome)    Polycystic disease, ovaries     MEDICATIONS: Current Outpatient Medications on File Prior to Visit  Medication Sig Dispense Refill   Prenatal Vit-Fe Fumarate-FA (MULTIVITAMIN-PRENATAL) 27-0.8 MG TABS tablet Take 1 tablet by mouth daily at 12 noon.     No current facility-administered medications on file prior to visit.    ALLERGIES: No Known Allergies  FAMILY HISTORY: No family history on file.    Objective:  Blood pressure 113/64, pulse 68, height 5\' 3"  (1.6 m), weight 263 lb (119.3 kg), SpO2 98%. General: No acute distress.  Patient appears well-groomed.   Head:  Normocephalic/atraumatic Head:  Normocephalic/atraumatic Neck:  Supple.  No paraspinal tenderness.  Full range of motion. Neuro:  Alert and oriented.  Speech fluent and not dysarthric.  Language intact.  CN II-XII intact.  Bulk and tone normal.  Muscle strength 5/5 throughout.  Deep tendon reflexes 2+ throughout.  Gait normal.  Romberg negative.   Shon Millet, DO  CC: [name], PA-C

## 2023-06-03 ENCOUNTER — Ambulatory Visit: Payer: Commercial Managed Care - PPO | Admitting: Neurology

## 2023-06-03 ENCOUNTER — Encounter: Payer: Self-pay | Admitting: Neurology

## 2023-06-03 VITALS — BP 113/64 | HR 68 | Ht 63.0 in | Wt 263.0 lb

## 2023-06-03 DIAGNOSIS — G43009 Migraine without aura, not intractable, without status migrainosus: Secondary | ICD-10-CM

## 2023-08-31 LAB — OB RESULTS CONSOLE GBS: GBS: NEGATIVE

## 2023-09-24 ENCOUNTER — Other Ambulatory Visit: Payer: Self-pay | Admitting: Obstetrics and Gynecology

## 2023-09-26 ENCOUNTER — Encounter (HOSPITAL_COMMUNITY)

## 2023-09-26 ENCOUNTER — Inpatient Hospital Stay (HOSPITAL_COMMUNITY): Attending: Physician Assistant

## 2023-09-26 ENCOUNTER — Encounter (HOSPITAL_COMMUNITY): Payer: Self-pay

## 2023-09-27 ENCOUNTER — Inpatient Hospital Stay (HOSPITAL_COMMUNITY): Admitting: Anesthesiology

## 2023-09-27 ENCOUNTER — Inpatient Hospital Stay (HOSPITAL_COMMUNITY)
Admission: AD | Admit: 2023-09-27 | Discharge: 2023-09-30 | DRG: 807 | Disposition: A | Discharge status: Home / Self Care | Attending: Obstetrics and Gynecology | Admitting: Obstetrics and Gynecology

## 2023-09-27 ENCOUNTER — Other Ambulatory Visit: Payer: Self-pay

## 2023-09-27 ENCOUNTER — Encounter (HOSPITAL_COMMUNITY): Payer: Self-pay | Admitting: Obstetrics and Gynecology

## 2023-09-27 DIAGNOSIS — Z3A39 39 weeks gestation of pregnancy: Secondary | ICD-10-CM | POA: Diagnosis not present

## 2023-09-27 DIAGNOSIS — Z3490 Encounter for supervision of normal pregnancy, unspecified, unspecified trimester: Principal | ICD-10-CM | POA: Diagnosis present

## 2023-09-27 DIAGNOSIS — Z8249 Family history of ischemic heart disease and other diseases of the circulatory system: Secondary | ICD-10-CM

## 2023-09-27 DIAGNOSIS — K219 Gastro-esophageal reflux disease without esophagitis: Secondary | ICD-10-CM | POA: Diagnosis present

## 2023-09-27 DIAGNOSIS — O24424 Gestational diabetes mellitus in childbirth, insulin controlled: Secondary | ICD-10-CM | POA: Diagnosis present

## 2023-09-27 DIAGNOSIS — O9962 Diseases of the digestive system complicating childbirth: Secondary | ICD-10-CM | POA: Diagnosis present

## 2023-09-27 DIAGNOSIS — O99214 Obesity complicating childbirth: Secondary | ICD-10-CM | POA: Diagnosis present

## 2023-09-27 HISTORY — DX: Gestational diabetes mellitus in pregnancy, unspecified control: O24.419

## 2023-09-27 HISTORY — DX: Migraine, unspecified, not intractable, without status migrainosus: G43.909

## 2023-09-27 LAB — GLUCOSE, CAPILLARY
Glucose-Capillary: 120 mg/dL — ABNORMAL HIGH (ref 70–99)
Glucose-Capillary: 76 mg/dL (ref 70–99)
Glucose-Capillary: 103 mg/dL — ABNORMAL HIGH (ref 70–99)
Glucose-Capillary: 87 mg/dL (ref 70–99)
Glucose-Capillary: 124 mg/dL — ABNORMAL HIGH (ref 70–99)
Glucose-Capillary: 76 mg/dL (ref 70–99)

## 2023-09-27 LAB — CBC
HCT: 32.3 % — ABNORMAL LOW (ref 36.0–46.0)
Hemoglobin: 10.4 g/dL — ABNORMAL LOW (ref 12.0–15.0)
MCH: 29.3 pg (ref 26.0–34.0)
MCHC: 32.2 g/dL (ref 30.0–36.0)
MCV: 91 fL (ref 80.0–100.0)
Platelets: 281 10*3/uL (ref 150–400)
RBC: 3.55 MIL/uL — ABNORMAL LOW (ref 3.87–5.11)
RDW: 15.4 % (ref 11.5–15.5)
WBC: 12.7 10*3/uL — ABNORMAL HIGH (ref 4.0–10.5)
nRBC: 0 % (ref 0.0–0.2)

## 2023-09-27 LAB — RPR: RPR Ser Ql: NONREACTIVE

## 2023-09-27 LAB — TYPE AND SCREEN
ABO/RH(D): O POS
Antibody Screen: NEGATIVE

## 2023-09-27 MED ORDER — LACTATED RINGERS IV SOLN: 500.0000 mL | Freq: Once | INTRAVENOUS | Status: DC

## 2023-09-27 MED ORDER — TERBUTALINE SULFATE 1 MG/ML IJ SOLN: 0.2500 mg | Freq: Once | INTRAMUSCULAR | Status: DC | PRN

## 2023-09-27 MED ORDER — MISOPROSTOL 25 MCG QUARTER TABLET
25.0000 ug | ORAL_TABLET | Freq: Once | ORAL | Status: AC
  Administered 2023-09-27: 25 ug via ORAL
  Filled 2023-09-27: qty 1

## 2023-09-27 MED ORDER — SOD CITRATE-CITRIC ACID 500-334 MG/5ML PO SOLN
30.0000 mL | ORAL | Status: DC | PRN
  Administered 2023-09-28: 30 mL via ORAL
  Filled 2023-09-27: qty 30

## 2023-09-27 MED ORDER — LACTATED RINGERS IV SOLN: INTRAVENOUS | Status: AC

## 2023-09-27 MED ORDER — EPHEDRINE 5 MG/ML INJ: 10.0000 mg | INTRAVENOUS | Status: DC | PRN

## 2023-09-27 MED ORDER — OXYTOCIN-SODIUM CHLORIDE 30-0.9 UT/500ML-% IV SOLN
1.0000 m[IU]/min | INTRAVENOUS | Status: DC
  Administered 2023-09-27 – 2023-09-28 (×2): 2 m[IU]/min via INTRAVENOUS

## 2023-09-27 MED ORDER — INSULIN ASPART 100 UNIT/ML IJ SOLN
0.0000 [IU] | INTRAMUSCULAR | Status: DC
  Administered 2023-09-28 (×3): 1 [IU] via SUBCUTANEOUS

## 2023-09-27 MED ORDER — FENTANYL-BUPIVACAINE-NACL 0.5-0.125-0.9 MG/250ML-% EP SOLN
12.0000 mL/h | EPIDURAL | Status: DC | PRN
  Administered 2023-09-27 – 2023-09-28 (×2): 12 mL/h via EPIDURAL
  Filled 2023-09-27 (×2): qty 250

## 2023-09-27 MED ORDER — FENTANYL CITRATE (PF) 100 MCG/2ML IJ SOLN
100.0000 ug | INTRAMUSCULAR | Status: DC | PRN
  Administered 2023-09-27 (×3): 100 ug via INTRAVENOUS
  Filled 2023-09-27 (×3): qty 2

## 2023-09-27 MED ORDER — OXYTOCIN-SODIUM CHLORIDE 30-0.9 UT/500ML-% IV SOLN
2.5000 [IU]/h | INTRAVENOUS | Status: DC
Start: 2023-09-27 — End: 2023-09-28
  Filled 2023-09-27: qty 500

## 2023-09-27 MED ORDER — PHENYLEPHRINE 80 MCG/ML (10ML) SYRINGE FOR IV PUSH (FOR BLOOD PRESSURE SUPPORT): 80.0000 ug | PREFILLED_SYRINGE | INTRAVENOUS | Status: DC | PRN

## 2023-09-27 MED ORDER — DIPHENHYDRAMINE HCL 50 MG/ML IJ SOLN: 12.5000 mg | INTRAMUSCULAR | Status: DC | PRN

## 2023-09-27 MED ORDER — LIDOCAINE HCL (PF) 1 % IJ SOLN
INTRAMUSCULAR | Status: DC | PRN
  Administered 2023-09-27 (×2): 4 mL via EPIDURAL

## 2023-09-27 MED ORDER — MISOPROSTOL 25 MCG QUARTER TABLET
25.0000 ug | ORAL_TABLET | ORAL | Status: DC | PRN
  Administered 2023-09-27: 25 ug via VAGINAL
  Filled 2023-09-27: qty 1

## 2023-09-27 MED ORDER — ACETAMINOPHEN 325 MG PO TABS: 650.0000 mg | ORAL_TABLET | ORAL | Status: DC | PRN

## 2023-09-27 MED ORDER — LACTATED RINGERS IV SOLN: 500.0000 mL | INTRAVENOUS | Status: AC | PRN

## 2023-09-27 MED ORDER — LIDOCAINE HCL (PF) 1 % IJ SOLN: 30.0000 mL | INTRAMUSCULAR | Status: DC | PRN

## 2023-09-27 MED ORDER — ONDANSETRON HCL 4 MG/2ML IJ SOLN
4.0000 mg | Freq: Four times a day (QID) | INTRAMUSCULAR | Status: DC | PRN
  Administered 2023-09-28 (×2): 4 mg via INTRAVENOUS
  Filled 2023-09-27 (×2): qty 2

## 2023-09-27 MED ORDER — OXYTOCIN BOLUS FROM INFUSION
333.0000 mL | Freq: Once | INTRAVENOUS | Status: AC
  Administered 2023-09-28: 333 mL via INTRAVENOUS

## 2023-09-27 NOTE — Progress Notes (Signed)
 Labor Progress Note  S/O: Pt comfortable with epidural  Vitals:   09/27/23 2231 09/27/23 2301  BP: 131/63 124/62  Pulse: 69 65  Resp:    Temp:    SpO2:     SVE: 5/60/-3  EFM: Cat I baseline 140 bpm mod var +accels, -decels Toco: ctxs q 1-3 min  A/P: 27Y G1P0 @ 39.1 IOL GDMA2  -IOL: s/p cytotec, balloon, and now on Pitocin of 10mU, consented for AROM--clear fluid -Cont EFM/Toco -GBS NEG -Epidural labor pain mgmt -GDMA2: last CBG 76, cont to monitor -Routine intrapartum care -Anticipate SVD  Kenidi Elenbaas A Sasha Rueth 09/27/23 11:58 PM

## 2023-09-27 NOTE — H&P (Signed)
 Shelly Carpenter is Shelly 28 y.o. female G1P0 [redacted]w[redacted]d presenting for IOL for GDMA2  Pregnancy dated by LMP c/w 8w sono. Routine prenatal care at [facility]. Routine PNL WNL except Rubella Non-Immune. Patient had Shelly low risk NIPS and negative AFP1 screen.  History of PCOS but spontaneous conception. Was on Metformin prior to pregnancy for PCOS and stopped after first trimester. Early 1hr GTT screen positive for GDM and Metformin resumed. Initially blood sugars difficult to control, but more recently have improved on regimen of Metformin 1,000 mg at bedtime and Humulin insulin 26U at bedtime. She had Shelly normal fetal anatomy scan as well as Shelly normal fetal echo. Followed with growth scans, most recent done at 35w showed vertex presentation and EFW 5#9 (35%) HC 42% AC 67% with anterior placenta. Reassuring antenatal fetal testing done. GBS screen negative.   Patient and her husband 'Shelly Carpenter' are expecting Shelly baby girl 'Shelly Carpenter'  OB History     Gravida  1   Para      Term      Preterm      AB      Living         SAB      IAB      Ectopic      Multiple      Live Births             Past Medical History:  Diagnosis Date   Gestational diabetes    Migraines    PCOS (polycystic ovarian syndrome)    Polycystic disease, ovaries    Past Surgical History:  Procedure Laterality Date   CHOLECYSTECTOMY, LAPAROSCOPIC     WISDOM TOOTH EXTRACTION     Family History: family history includes Hypertension in her father. Social History:  reports that she has never smoked. She has never used smokeless tobacco. She reports that she does not drink alcohol and does not use drugs.     Maternal Diabetes: Yes:  Diabetes Type:  Insulin/Medication controlled Genetic Screening: Normal Maternal Ultrasounds/Referrals: Normal Fetal Ultrasounds or other Referrals:  Fetal echo Maternal Substance Abuse:  No Significant Maternal Medications:  Meds include: Other: Metformin, Humulin insulin Significant Maternal  Lab Results:  Group B Strep negative Other Comments:  None  Review of Systems Per HPI Maternal Exam:  Uterine Assessment: Contraction strength is mild.  Contraction frequency is rare.  Abdomen: Patient reports no abdominal tenderness. Estimated fetal weight is 6.5.   Fetal presentation: vertex Introitus: Normal vulva. Normal vagina.  Pelvis: adequate for delivery.     Fetal Exam Fetal State Assessment: Category I - tracings are normal.   Physical Exam Constitutional:      Appearance: She is obese.  Cardiovascular:     Rate and Rhythm: Normal rate.  Pulmonary:     Effort: Pulmonary effort is normal.  Abdominal:     Tenderness: There is no abdominal tenderness.  Genitourinary:    General: Normal vulva.  Musculoskeletal:        General: Normal range of motion.     Cervical back: Normal range of motion.  Skin:    General: Skin is warm and dry.  Neurological:     General: No focal deficit present.     Mental Status: She is alert and oriented to person, place, and time.  Psychiatric:        Mood and Affect: Mood normal.        Behavior: Behavior normal.     Dilation: Fingertip Effacement (%): Thick Station: -  3 Exam by:: Shelly Hill RN Blood pressure 137/64, pulse 86, temperature 97.6 F (36.4 C), temperature source Oral, resp. rate 18, height 5\' 3"  (1.6 m), weight 128.6 kg.  Prenatal labs: ABO, Rh:  --/--/O POS (05/26 0320) Antibody: NEG (05/26 0320) Rubella: Immune (11/06 0000) RPR: Nonreactive (11/06 0000)  HBsAg: Negative (11/06 0000)  HIV: Non-reactive (11/06 0000)  GBS: Negative/-- (04/29 0000)   ChemistryNo results for input(s): "NA", "K", "CL", "CO2", "GLUCOSE", "BUN", "CREATININE", "CALCIUM", "GFRNONAA", "GFRAA", "ANIONGAP" in the last 168 hours.  No results for input(s): "PROT", "ALBUMIN", "AST", "ALT", "ALKPHOS", "BILITOT" in the last 168 hours. Hematology Recent Labs  Lab 09/27/23 0320  WBC 12.7*  RBC 3.55*  HGB 10.4*  HCT 32.3*  MCV 91.0  MCH  29.3  MCHC 32.2  RDW 15.4  PLT 281   Cardiac EnzymesNo results for input(s): "TROPONINI" in the last 168 hours. No results for input(s): "TROPIPOC" in the last 168 hours.  BNPNo results for input(s): "BNP", "PROBNP" in the last 168 hours.  DDimer No results for input(s): "DDIMER" in the last 168 hours.   Assessment/Plan: Shelly Carpenter is Shelly 29 y.o. female G1P0 [redacted]w[redacted]d admitted for IOL for GDMA2.   -Admit to LD -Routine admission labs -IOL: cervical ripening with cytotec 25mcg PV q4hr PRN. Plan for likely balloon, Pitocin, and AROM when appropriate -Continuous EFM/Toco -GBS NEG -May have IV pain meds/Epidural on request -GDMA2: CBG monitoring q4hr w/ ISS PRN -EFW 6.5lbs -Rubella NI, offer PP MMR -Routine intrapartum care -Anticipate SVD  Shelly Carpenter Shelly Carpenter 09/27/2023, 5:28 AM

## 2023-09-27 NOTE — Anesthesia Preprocedure Evaluation (Signed)
 Anesthesia Evaluation  Patient identified by MRN, date of birth, ID band Patient awake    Reviewed: Allergy & Precautions, NPO status , Patient's Chart, lab work & pertinent test results  History of Anesthesia Complications Negative for: history of anesthetic complications  Airway Mallampati: III  TM Distance: >3 FB Neck ROM: Full    Dental   Pulmonary neg pulmonary ROS   Pulmonary exam normal breath sounds clear to auscultation       Cardiovascular negative cardio ROS  Rhythm:Regular Rate:Normal     Neuro/Psych  Headaches    GI/Hepatic Neg liver ROS,GERD  Medicated,,  Endo/Other  diabetes, Gestational, Oral Hypoglycemic Agents, Insulin Dependent  Class 4 obesity  Renal/GU negative Renal ROS     Musculoskeletal   Abdominal  (+) + obese  Peds  Hematology  (+) Blood dyscrasia, anemia Lab Results      Component                Value               Date                      WBC                      12.7 (H)            09/27/2023                HGB                      10.4 (L)            09/27/2023                HCT                      32.3 (L)            09/27/2023                MCV                      91.0                09/27/2023                PLT                      281                 09/27/2023              Anesthesia Other Findings   Reproductive/Obstetrics (+) Pregnancy                             Anesthesia Physical Anesthesia Plan  ASA: 3  Anesthesia Plan: Epidural   Post-op Pain Management:    Induction:   PONV Risk Score and Plan:   Airway Management Planned: Natural Airway  Additional Equipment:   Intra-op Plan:   Post-operative Plan:   Informed Consent: I have reviewed the patients History and Physical, chart, labs and discussed the procedure including the risks, benefits and alternatives for the proposed anesthesia with the patient or authorized  representative who has indicated his/her understanding and acceptance.       Plan Discussed with: Anesthesiologist  Anesthesia Plan  Comments: (I have discussed risks of neuraxial anesthesia including but not limited to infection, bleeding, nerve injury, back pain, headache, seizures, and failure of block. Patient denies bleeding disorders and is not currently anticoagulated. Labs have been reviewed. Risks and benefits discussed. All patient's questions answered.  )       Anesthesia Quick Evaluation

## 2023-09-27 NOTE — Anesthesia Procedure Notes (Signed)
 Epidural Patient location during procedure: OB Start time: 09/27/2023 9:22 PM End time: 09/27/2023 9:27 PM  Staffing Anesthesiologist: Conard Decent, MD Performed: anesthesiologist   Preanesthetic Checklist Completed: patient identified, IV checked, site marked, risks and benefits discussed, surgical consent, monitors and equipment checked, pre-op evaluation and timeout performed  Epidural Patient position: sitting Prep: DuraPrep and site prepped and draped Patient monitoring: continuous pulse ox and blood pressure Approach: midline Location: L3-L4 Injection technique: LOR saline  Needle:  Needle type: Tuohy  Needle gauge: 17 G Needle length: 9 cm and 9 Needle insertion depth: 7 cm Catheter type: closed end flexible Catheter size: 19 Gauge Catheter at skin depth: 12 cm Test dose: negative  Assessment Events: blood not aspirated, no cerebrospinal fluid, injection not painful, no injection resistance, no paresthesia and negative IV test  Additional Notes The patient has requested an epidural for labor pain management. Risks and benefits including, but not limited to, infection, bleeding, local anesthetic toxicity, headache, hypotension, back pain, block failure, etc. were discussed with the patient. The patient expressed understanding and consented to the procedure. I confirmed that the patient has no bleeding disorders and is not taking blood thinners. I confirmed the patient's last platelet count with the nurse. A time-out was performed immediately prior to the procedure. Please see nursing documentation for vital signs. Sterile technique was used throughout the whole procedure. Once LOR achieved, the epidural catheter threaded easily without resistance. Aspiration of the catheter was negative for blood and CSF. The epidural was dosed slowly and an infusion was started.  1 attempt(s)Reason for block:procedure for pain

## 2023-09-27 NOTE — Progress Notes (Signed)
 Labor Progress Note  S/O: Pt rating cramping pain at a 4 on pain scale  Vitals:   09/27/23 0249 09/27/23 0602  BP: 137/64 121/76  Pulse: 86 77  Resp: 18 19  Temp: 97.6 F (36.4 C) 98.2 F (36.8 C)   SVE: 1/50/-3  EFM: CaT I baseline 135 bpm mod var +accels, -decels Toco: irregular ctxs  A/P: 27Y G1P0 @ 39.1 IOL GDMA2  -IOL: s/p 1st dose of cytotec. Consented for Foley balloon placement--well tolerated by patient Cook balloon with 60 cc, will give 2nd dose of cytotec buccal route, plan for AROM once balloon expels -Cont EFM/Toco -GBS NEG -May have IV pain meds/Epidural on request -GDMA2: last CBG 76, cont to monitor -Routine intrapartum care -Anticipate SVD  Teri Legacy A Alim Cattell 09/27/23 9:31 AM

## 2023-09-28 ENCOUNTER — Encounter (HOSPITAL_COMMUNITY): Payer: Self-pay | Admitting: Obstetrics and Gynecology

## 2023-09-28 LAB — GLUCOSE, CAPILLARY
Glucose-Capillary: 97 mg/dL (ref 70–99)
Glucose-Capillary: 101 mg/dL — ABNORMAL HIGH (ref 70–99)
Glucose-Capillary: 94 mg/dL (ref 70–99)
Glucose-Capillary: 76 mg/dL (ref 70–99)

## 2023-09-28 MED ORDER — LACTATED RINGERS AMNIOINFUSION: INTRAVENOUS | Status: DC

## 2023-09-28 MED ORDER — LACTATED RINGERS IV SOLN: INTRAVENOUS | Status: DC

## 2023-09-28 MED ORDER — SENNOSIDES-DOCUSATE SODIUM 8.6-50 MG PO TABS
2.0000 | ORAL_TABLET | Freq: Every day | ORAL | Status: DC
  Administered 2023-09-29 – 2023-09-30 (×2): 2 via ORAL
  Filled 2023-09-28 (×2): qty 2

## 2023-09-28 MED ORDER — IBUPROFEN 600 MG PO TABS
600.0000 mg | ORAL_TABLET | Freq: Four times a day (QID) | ORAL | Status: DC
  Administered 2023-09-28 – 2023-09-30 (×8): 600 mg via ORAL
  Filled 2023-09-28 (×9): qty 1

## 2023-09-28 MED ORDER — BUPIVACAINE HCL (PF) 0.25 % IJ SOLN
INTRAMUSCULAR | Status: DC | PRN
  Administered 2023-09-28 (×2): 5 mL via EPIDURAL

## 2023-09-28 MED ORDER — ONDANSETRON HCL 4 MG PO TABS: 4.0000 mg | ORAL_TABLET | ORAL | Status: DC | PRN

## 2023-09-28 MED ORDER — ZOLPIDEM TARTRATE 5 MG PO TABS
5.0000 mg | ORAL_TABLET | Freq: Every evening | ORAL | Status: DC | PRN
Start: 2023-09-28 — End: 2023-09-30

## 2023-09-28 MED ORDER — BENZOCAINE-MENTHOL 20-0.5 % EX AERO
1.0000 | INHALATION_SPRAY | CUTANEOUS | Status: DC | PRN
  Administered 2023-09-28: 1 via TOPICAL
  Filled 2023-09-28: qty 56

## 2023-09-28 MED ORDER — WITCH HAZEL-GLYCERIN EX PADS: 1.0000 | MEDICATED_PAD | CUTANEOUS | Status: DC | PRN

## 2023-09-28 MED ORDER — DIPHENHYDRAMINE HCL 25 MG PO CAPS: 25.0000 mg | ORAL_CAPSULE | Freq: Four times a day (QID) | ORAL | Status: DC | PRN

## 2023-09-28 MED ORDER — PRENATAL MULTIVITAMIN CH
1.0000 | ORAL_TABLET | Freq: Every day | ORAL | Status: DC
Start: 2023-09-29 — End: 2023-09-30
  Administered 2023-09-29 – 2023-09-30 (×2): 1 via ORAL
  Filled 2023-09-28 (×2): qty 1

## 2023-09-28 MED ORDER — SIMETHICONE 80 MG PO CHEW: 80.0000 mg | CHEWABLE_TABLET | ORAL | Status: DC | PRN

## 2023-09-28 MED ORDER — DIBUCAINE (PERIANAL) 1 % EX OINT: 1.0000 | TOPICAL_OINTMENT | CUTANEOUS | Status: DC | PRN

## 2023-09-28 MED ORDER — TETANUS-DIPHTH-ACELL PERTUSSIS 5-2.5-18.5 LF-MCG/0.5 IM SUSY: 0.5000 mL | PREFILLED_SYRINGE | Freq: Once | INTRAMUSCULAR | Status: DC

## 2023-09-28 MED ORDER — ONDANSETRON HCL 4 MG/2ML IJ SOLN
4.0000 mg | INTRAMUSCULAR | Status: DC | PRN
Start: 2023-09-28 — End: 2023-09-30

## 2023-09-28 MED ORDER — LACTATED RINGERS IV SOLN
500.0000 mL | INTRAVENOUS | Status: DC | PRN
  Administered 2023-09-28: 500 mL via INTRAVENOUS

## 2023-09-28 MED ORDER — ACETAMINOPHEN 325 MG PO TABS: 650.0000 mg | ORAL_TABLET | ORAL | Status: DC | PRN

## 2023-09-28 MED ORDER — COCONUT OIL OIL: 1.0000 | TOPICAL_OIL | Status: DC | PRN

## 2023-09-28 NOTE — Progress Notes (Signed)
 Labor Progress Note  S/O: Pt comfortable with epidural but starting to feel more pressure  Vitals:   09/28/23 0930 09/28/23 0947  BP: (!) 146/73   Pulse: 93   Resp: 18 18  Temp:  98.3 F (36.8 C)  SpO2: 99% 99%   SVE: 10/100/0 to +1  EFM: Cat I baseline 140 bpm mod var +accels, -decels Toco: ctxs q 5-6 min  A/P: 27Y G1P0 @ 39.1 IOL GDMA2  -IOL: s/p cytotec, balloon, AROM and Pitocin-- was turned off around 7AM for recurrent late decels, has since recovered and back to Cat I tracing, has progressed to complete but ctxs spaced out and fetal station remains high. Will allow to labor down while Pitocin is resumed to bring ctxs closer together, plan to try pushing once ctxs pattern improved -Cont EFM/Toco -GBS NEG -Epidural labor pain mgmt -GDMA2: last CBG 101, s/p 1U insulin, cont to monitor -Routine intrapartum care -Anticipate SVD  Chaniah Cisse A Margie Urbanowicz 09/28/23 10:22 AM

## 2023-09-28 NOTE — Lactation Note (Signed)
 This note was copied from a baby's chart. Lactation Consultation Note  Patient Name: Shelly Carpenter [id number] Date: 09/28/2023 Age:27 hours Reason for consult: Initial assessment  MOB is formula feeding only. Please let LC team know if MOB is requiring our services at any time.  Feeding Mother's Current Feeding Choice: Formula  Consult Status Consult Status: Complete    [name] BS, IBCLC 09/28/2023, 3:36 PM

## 2023-09-29 LAB — CBC
HCT: 26.9 % — ABNORMAL LOW (ref 36.0–46.0)
Hemoglobin: 8.9 g/dL — ABNORMAL LOW (ref 12.0–15.0)
MCH: 30 pg (ref 26.0–34.0)
MCHC: 33.1 g/dL (ref 30.0–36.0)
MCV: 90.6 fL (ref 80.0–100.0)
Platelets: 236 10*3/uL (ref 150–400)
RBC: 2.97 MIL/uL — ABNORMAL LOW (ref 3.87–5.11)
RDW: 15.7 % — ABNORMAL HIGH (ref 11.5–15.5)
WBC: 15.7 10*3/uL — ABNORMAL HIGH (ref 4.0–10.5)
nRBC: 0 % (ref 0.0–0.2)

## 2023-09-29 LAB — GLUCOSE, CAPILLARY
Glucose-Capillary: 83 mg/dL (ref 70–99)
Glucose-Capillary: 80 mg/dL (ref 70–99)

## 2023-09-29 NOTE — Progress Notes (Signed)
 No c/o, pain controlled, nml lochia Voids w/o difficulty Formula feeding  Patient Vitals for the past 24 hrs:  BP Temp Temp src Pulse Resp SpO2  09/29/23 0540 120/60 98 F (36.7 C) Oral 75 18 100 %  09/29/23 0330 122/60 97.9 F (36.6 C) Oral 75 18 97 %  09/28/23 2300 (!) 121/55 98.2 F (36.8 C) Oral 75 16 100 %  09/28/23 1841 127/70 98.1 F (36.7 C) -- 76 18 98 %  09/28/23 1732 (!) 109/54 98.1 F (36.7 C) Oral 85 18 98 %  09/28/23 1655 129/67 98.4 F (36.9 C) Oral 82 16 --  09/28/23 1630 139/68 -- -- 86 16 --  09/28/23 1615 (!) 141/72 -- -- 77 18 --  09/28/23 1600 131/70 -- -- 80 18 --  09/28/23 1545 (!) 128/52 -- -- 83 16 --  09/28/23 1530 129/71 -- -- 88 18 --  09/28/23 1515 (!) 121/59 -- -- 95 18 --  09/28/23 1500 121/61 -- -- (!) 102 -- --  09/28/23 1455 131/74 -- -- (!) 111 -- --  09/28/23 1430 134/70 (!) 97.5 F (36.4 C) Axillary 80 20 98 %  09/28/23 1400 (!) 132/49 -- -- 84 20 --  09/28/23 1330 127/60 -- -- 82 20 --  09/28/23 1300 (!) 141/79 98.6 F (37 C) Oral 86 18 --  09/28/23 1230 (!) 132/52 -- -- 67 18 98 %  09/28/23 1205 (!) 125/53 -- -- 68 18 96 %  09/28/23 1200 (!) 109/92 -- -- 86 16 --  09/28/23 1140 (!) 113/94 -- -- 95 16 100 %  09/28/23 1135 (!) 114/37 -- -- 80 18 100 %  09/28/23 1130 (!) 120/52 -- -- 74 16 99 %  09/28/23 1125 (!) 119/52 -- -- 85 16 99 %  09/28/23 1120 (!) 118/57 -- -- 74 16 99 %  09/28/23 1115 (!) 119/53 -- -- 69 16 98 %  09/28/23 1110 123/61 -- -- 69 18 97 %  09/28/23 1105 (!) 125/54 -- -- 75 18 97 %  09/28/23 1101 120/66 -- -- 70 16 --  09/28/23 1100 120/66 -- -- 70 16 --  09/28/23 1030 (!) 108/51 -- -- 75 18 --   A&ox3 Nml respirations Abd; soft,nt,nd, fundus firm and below umb LE: no edema,nt bilat     Latest Ref Rng & Units 09/29/2023    4:35 AM 09/27/2023    3:20 AM 05/20/2021    9:23 AM  CBC  WBC 4.0 - 10.5 K/uL 15.7  12.7  8.9   Hemoglobin 12.0 - 15.0 g/dL 8.9  21.3  08.6   Hematocrit 36.0 - 46.0 % 26.9  32.3  38.8    Platelets 150 - 400 K/uL 236  281  390    A/P: ppd1 s/p svd Doing well - contin care, d/c home tomorrow Anemia of pregnancy with acute change d/t blood loss - asymptomatic, not sig; iron daily Rh pos Rubella immune Gdma2 -  bs wnl, can d/c and f/u pp Formula feeding, girl

## 2023-09-29 NOTE — Anesthesia Postprocedure Evaluation (Signed)
 Anesthesia Post Note  Patient: Shelly Carpenter  Procedure(s) Performed: AN AD HOC LABOR EPIDURAL     Patient location during evaluation: Mother Baby Anesthesia Type: Epidural Level of consciousness: awake and alert Pain management: pain level controlled Vital Signs Assessment: post-procedure vital signs reviewed and stable Respiratory status: spontaneous breathing, nonlabored ventilation and respiratory function stable Cardiovascular status: stable Postop Assessment: no headache, no backache and epidural receding Anesthetic complications: no   No notable events documented.  Last Vitals:  Vitals:   09/29/23 0330 09/29/23 0540  BP: 122/60 120/60  Pulse: 75 75  Resp: 18 18  Temp: 36.6 C 36.7 C  SpO2: 97% 100%    Last Pain:  Vitals:   09/29/23 0540  TempSrc: Oral  PainSc:    Pain Goal: Patients Stated Pain Goal: 6 (09/28/23 1105)                 Tiffanni Scarfo

## 2023-09-30 LAB — BIRTH TISSUE RECOVERY COLLECTION (PLACENTA DONATION)

## 2023-09-30 MED ORDER — IBUPROFEN 600 MG PO TABS: 600.0000 mg | ORAL_TABLET | Freq: Four times a day (QID) | ORAL | 0 refills | Status: AC

## 2023-09-30 MED ORDER — METFORMIN HCL 500 MG PO TABS: 500.0000 mg | ORAL_TABLET | Freq: Every day | ORAL | 0 refills | Status: AC

## 2023-09-30 MED ORDER — ACETAMINOPHEN 325 MG PO TABS: 650.0000 mg | ORAL_TABLET | ORAL | Status: AC | PRN

## 2023-09-30 NOTE — Discharge Summary (Signed)
 Postpartum Discharge Summary    Patient Name: Shelly Carpenter DOB: 04/08/1996 MRN: [id number]  Date of admission: 09/27/2023 Delivery date:09/28/2023 Delivering provider: Richard Champion A Date of discharge: 09/30/2023  Admitting diagnosis: Encounter for induction of labor [Z34.90] A2GDM  Intrauterine pregnancy: [redacted]w[redacted]d     Secondary diagnosis:  SVD, 2nd degree perineal laceration     Discharge diagnosis: Term Pregnancy Delivered and GDM A2                                              Post partum procedures:none Augmentation: AROM, Pitocin, and Cytotec Complications: None  Hospital course: Induction of Labor With Vaginal Delivery   28 y.o. yo G1P1001 at [redacted]w[redacted]d was admitted to the hospital 09/27/2023 for induction of labor.  Indication for induction: A2 DM.  Patient had an labor course complicated by none  Membrane Rupture Time/Date: 11:24 PM,09/27/2023  Delivery Method:Vaginal, Spontaneous Operative Delivery:N/A Episiotomy: None Lacerations:  2nd degree Details of delivery can be found in separate delivery note.  Patient had a postpartum course complicated by none. Patient is discharged home 09/30/23.  Newborn Data: Birth date:09/28/2023 Birth time:2:51 PM Gender:Female Living status:Living Apgars:8 ,9  Weight:7 lb 2.6 oz (3.25 kg)  Magnesium Sulfate received: No BMZ received: No Rhophylac:N/A MMR:N/A T-DaP:Given prenatally Flu: N/A RSV Vaccine received: No Transfusion:No Immunizations administered: Immunization History  Administered Date(s) Administered   Influenza,inj,Quad PF,6+ Mos 12/31/2015   PPD Test 12/31/2015, 02/11/2016   Tdap 12/16/2007, 02/11/2016    Physical exam  Vitals:   09/29/23 0540 09/29/23 1526 09/29/23 2151 09/30/23 0522  BP: 120/60 126/66 (!) 119/51 124/71  Pulse: 75 72 63 (!) 58  Resp: 18 17 17 16   Temp: 98 F (36.7 C) 97.9 F (36.6 C) 98.7 F (37.1 C)   TempSrc: Oral Oral Oral   SpO2: 100% 98%  99%  Weight:      Height:        General: alert, cooperative, and no distress Lochia: appropriate Uterine Fundus: firm Incision: Healing well with no significant drainage, No significant erythema DVT Evaluation: No evidence of DVT seen on physical exam. Labs: Lab Results  Component Value Date   WBC 15.7 (H) 09/29/2023   HGB 8.9 (L) 09/29/2023   HCT 26.9 (L) 09/29/2023   MCV 90.6 09/29/2023   PLT 236 09/29/2023      Latest Ref Rng & Units 05/20/2021    9:23 AM  CMP  Glucose 70 - 99 mg/dL 93   BUN 6 - 20 mg/dL 11   Creatinine 1.88 - 1.00 mg/dL 4.16   Sodium 606 - 301 mmol/L 137   Potassium 3.5 - 5.2 mmol/L 4.6   Chloride 96 - 106 mmol/L 103   CO2 20 - 29 mmol/L 19   Calcium 8.7 - 10.2 mg/dL 9.5   Total Protein 6.0 - 8.5 g/dL 6.9   Total Bilirubin 0.0 - 1.2 mg/dL <6.0   Alkaline Phos 44 - 121 IU/L 67   AST 0 - 40 IU/L 11   ALT 0 - 32 IU/L 11    Edinburgh Score:    09/28/2023    5:32 PM  Edinburgh Postnatal Depression Scale Screening Tool  I have been able to laugh and see the funny side of things. 0  I have looked forward with enjoyment to things. 0  I have blamed myself unnecessarily when things went wrong.  1  I have been anxious or worried for no good reason. 2  I have felt scared or panicky for no good reason. 1  Things have been getting on top of me. 1  I have been so unhappy that I have had difficulty sleeping. 0  I have felt sad or miserable. 0  I have been so unhappy that I have been crying. 0  The thought of harming myself has occurred to me. 0  Edinburgh Postnatal Depression Scale Total 5      After visit meds:  Allergies as of 09/30/2023   No Known Allergies      Medication List     STOP taking these medications    famotidine 40 MG tablet Commonly known as: PEPCID   insulin NPH Human 100 UNIT/ML injection Commonly known as: NOVOLIN N       TAKE these medications    acetaminophen 325 MG tablet Commonly known as: Tylenol Take 2 tablets (650 mg total) by mouth every 4  (four) hours as needed (for pain scale < 4).   ibuprofen 600 MG tablet Commonly known as: ADVIL Take 1 tablet (600 mg total) by mouth every 6 (six) hours.   metFORMIN 500 MG tablet Commonly known as: GLUCOPHAGE Take 1 tablet (500 mg total) by mouth daily with breakfast. What changed:  how much to take when to take this   multivitamin-prenatal 27-0.8 MG Tabs tablet Take 1 tablet by mouth daily at 12 noon.         Discharge home in stable condition Infant Feeding: Bottle Infant Disposition:home with mother Discharge instruction: per After Visit Summary and Postpartum booklet. Activity: Advance as tolerated. Pelvic rest for 6 weeks.  Diet: routine diet Anticipated Birth Control: Unsure Postpartum Appointment:6 weeks Additional Postpartum F/U: as needed  Future Appointments: Future Appointments  Date Time Provider Department Center  12/03/2023 10:50 AM Merriam Abbey, DO [facility] None   Follow up Visit:  Follow-up Information     Law, Cassandra A, DO Follow up in 5 week(s).   Specialty: Obstetrics and Gynecology Contact information: [address] [place] [address] [phone]                     09/30/2023 Shasta Deist, MD

## 2023-10-07 ENCOUNTER — Telehealth (HOSPITAL_COMMUNITY): Payer: Self-pay | Admitting: *Deleted

## 2023-10-07 NOTE — Telephone Encounter (Signed)
 10/07/2023  Name: Shelly Carpenter MRN: [id number] DOB: 29-Apr-1996  Reason for Call:  Transition of Care Hospital Discharge Call  Contact Status: Patient Contact Status: Complete  Language assistant needed: Interpreter Mode: Interpreter Not Needed        Follow-Up Questions: Do You Have Any Concerns About Your Health As You Heal From Delivery?: No Do You Have Any Concerns About Your Infants Health?: No  Edinburgh Postnatal Depression Scale:  In the Past 7 Days: I have been able to laugh and see the funny side of things.: As much as I always could I have looked forward with enjoyment to things.: As much as I ever did I have blamed myself unnecessarily when things went wrong.: Yes, some of the time I have been anxious or worried for no good reason.: Yes, sometimes I have felt scared or panicky for no good reason.: No, not much Things have been getting on top of me.: No, most of the time I have coped quite well I have been so unhappy that I have had difficulty sleeping.: Not at all I have felt sad or miserable.: No, not at all I have been so unhappy that I have been crying.: No, never The thought of harming myself has occurred to me.: Never Edinburgh Postnatal Depression Scale Total: 6  PHQ2-9 Depression Scale:     Discharge Follow-up: Edinburgh score requires follow up?: No Patient was advised of the following resources:: Support Group  Post-discharge interventions: Reviewed Newborn Safe Sleep Practices  Jenney Modest  10/07/2023 1618

## 2023-10-19 ENCOUNTER — Other Ambulatory Visit: Payer: Self-pay | Admitting: Neurology

## 2023-10-19 ENCOUNTER — Telehealth: Payer: Self-pay | Admitting: Neurology

## 2023-10-19 MED ORDER — UBRELVY 100 MG PO TABS: 1.0000 | ORAL_TABLET | ORAL | 5 refills | Status: AC | PRN

## 2023-10-19 MED ORDER — NORTRIPTYLINE HCL 10 MG PO CAPS: 10.0000 mg | ORAL_CAPSULE | Freq: Every day | ORAL | 1 refills | Status: DC

## 2023-10-19 MED ORDER — GABAPENTIN 300 MG PO CAPS: 300.0000 mg | ORAL_CAPSULE | Freq: Two times a day (BID) | ORAL | 5 refills | Status: AC

## 2023-10-19 NOTE — Telephone Encounter (Signed)
Patient not breast feeding 

## 2023-10-19 NOTE — Telephone Encounter (Signed)
 LMOVM for patient, Mychart message as well.  I refilled her previous medications: Nortriptyline  10mg  at bedtime Gabapentin  300mg  twice daily - I would like her to wait 2 weeks after starting the nortriptyline  and I would like her to first take 1 pill at bedtime for one week, then increase to 1 pill twice daily. Ubrelvy  as needed.

## 2023-10-19 NOTE — Telephone Encounter (Signed)
 Pt called in stating she was taken off of her migraine medication when she was pregnant. She just gave birth 3 weeks ago and her migraines are back. She is hoping to get put back on them.

## 2023-10-28 ENCOUNTER — Other Ambulatory Visit (HOSPITAL_COMMUNITY): Payer: Self-pay

## 2023-10-28 ENCOUNTER — Telehealth: Payer: Self-pay

## 2023-10-28 NOTE — Telephone Encounter (Signed)
 Pharmacy Patient Advocate Encounter   Received notification from CoverMyMeds that prior authorization for Ubrelvy  100MG  tablets is required/requested.   Insurance verification completed.   The patient is insured through [facility] .   Per test claim: PA required; PA submitted to above mentioned insurance via CoverMyMeds Key/confirmation #/EOC [id number] Status is pending

## 2023-11-01 NOTE — Telephone Encounter (Signed)
 Pharmacy Patient Advocate Encounter  Received notification from [employer] that Prior Authorization for Ubrelvy  100MG  tablets has been APPROVED from 10-28-2023 to 01-28-2024   PA #/Case ID/Reference #: [id number]

## 2023-11-26 ENCOUNTER — Other Ambulatory Visit (HOSPITAL_COMMUNITY): Payer: Self-pay

## 2023-12-03 ENCOUNTER — Ambulatory Visit: Payer: Commercial Managed Care - PPO | Admitting: Neurology

## 2024-01-31 ENCOUNTER — Telehealth: Payer: Self-pay | Admitting: Pharmacy Technician

## 2024-01-31 ENCOUNTER — Other Ambulatory Visit (HOSPITAL_COMMUNITY): Payer: Self-pay

## 2024-01-31 NOTE — Telephone Encounter (Signed)
 Pharmacy Patient Advocate Encounter   Received notification from CoverMyMeds that prior authorization for UBRELVY  100MG  is required/requested.   Insurance verification completed.   The patient is insured through [facility] .   Per test claim: PA required; PA submitted to above mentioned insurance via Latent Key/confirmation #/EOC [id number] Status is pending

## 2024-01-31 NOTE — Progress Notes (Unsigned)
 NEUROLOGY FOLLOW UP OFFICE NOTE  [place] COHICK [id number]  Assessment/Plan:   Migraine without aura, without status migrainosus, not intractable  Spinal pain - unclear etiology.  Not consistent with radiculopathy but not classic presentation for muscular or arthritic.    Migraine prevention:  Nortriptyline  10mg  at bedtime Migraine rescue:  Ubrelvy  100mg  Spinal pain:  gabapentin  300mg  twice daily Lifestyle modification: Limit use of pain relievers to no more than 9 days out of the month to prevent risk of rebound or medication-overuse headache. Diet modification/hydration/caffeine cessation Routine exercise Sleep hygiene Consider vitamins/supplements:  magnesium citrate 400mg  daily, riboflavin 400mg  daily, CoQ10 100mg  three times daily Keep headache diary Follow up ***        Subjective:  CARRAH EPPOLITO is a 28 year old right-handed female with migraines and PCOS who follows up for migraine.   UPDATE: She delivered a baby girl on 5/27.  She is not breastfeeding.  Her migraines steadily returned.  She was restarted on nortriptyline , gabapentin  and Ubrelvy  in June.  ***    Current NSAIDS/analgesics:  Tylenol  Current triptans: none Current ergotamine:  none Current anti-emetic:  none Current muscle relaxants:  none Current Antihypertensive medications:  none Current Antidepressant medications:  nortriptyline  10mg  at bedtime Current Anticonvulsant medications:  gabapentin  300mg  twice daily (chronic back pain) Current anti-CGRP:  Ubrelvy  100mg  Current Vitamins/Herbal/Supplements:  none Current Antihistamines/Decongestants:  none Other therapy:  none Current birth control:  Micronor     HISTORY:  Since early 2022, she has had headaches and dizziness. - Dizziness described as spinning and lightheadedness, sometimes feeling like she will pass out but hasn't.  Associated nausea.  Lasts at least 30-45 seconds but not necessarily with movement.  This occurs once every 2  months. - Bilateral tinnitus (ringing). - Headaches are moderate to severe posterior headaches, pressure like and sharp in quality with associated posterior neck pain, nausea, photophobia, phonophobia and blurred vision.  Still able to function.  They were initially occurring 3-4 times a week.  She started topiramate  and frequency decreased to 2-3 times a month.  They last 2 hours with Tylenol  but all day untreated. However, in February 2023, she developed midline spinal pain from the neck down to lumbar region.  Arms and legs may shake but no radicular pain or weakness of extremities.  Denies injury, strenuous activity or other precipitating event.  Constant and not aggravated by movement or prolonged inactivity.  This pain has increased headache frequency to 2 times a week.   She was prescribed anti-inflammatories and muscle relaxers (unsure of name) with no improvement.  She tried a family member's gabapentin  (300mg  twice daily) which helped.  In early January 2023, she had one particular headache that was severe and associated with black and white out of vision.  She needed to leave work.  It responded quickly (within 1 hour) to Nurtec but her insurance wouldn't approve it.     Reports history of headaches as a child that became infrequent by college, a mild diffuse pressure headache without associated symptoms.   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  Follow up MRI of cervical spine on 08/22/2021 confirmed hemangioma and no spinal cord abnormalities.  She had an eye exam that was unremarkable.  X-rays of thoracic and lumbar spine on 08/28/2021 were negative.  No family history of migraines  Past triptan:  sumatriptan tab, rizatriptan  Past antidepressant:  venlafaxine  Past antiepileptic:   topiramate  Past CGRP inhibitor:  none  PAST MEDICAL HISTORY: Past Medical History:  Diagnosis Date   Gestational diabetes    Migraines    PCOS (polycystic ovarian syndrome)    Polycystic disease, ovaries     MEDICATIONS: Current Outpatient Medications on File Prior to Visit  Medication Sig Dispense Refill   acetaminophen  (TYLENOL ) 325 MG tablet Take 2 tablets (650 mg total) by mouth every 4 (four) hours as needed (for pain scale < 4).     gabapentin  (NEURONTIN ) 300 MG capsule Take 1 capsule (300 mg total) by mouth 2 (two) times daily. 60 capsule 5   ibuprofen  (ADVIL ) 600 MG tablet Take 1 tablet (600 mg total) by mouth every 6 (six) hours. 30 tablet 0   metFORMIN  (GLUCOPHAGE ) 500 MG tablet Take 1 tablet (500 mg total) by mouth daily with breakfast. 90 tablet 0   nortriptyline  (PAMELOR ) 10 MG capsule Take 1 capsule (10 mg total) by mouth at bedtime. 90 capsule 1   Prenatal Vit-Fe Fumarate-FA (MULTIVITAMIN-PRENATAL) 27-0.8 MG TABS tablet Take 1 tablet by mouth daily at 12 noon.     Ubrogepant  (UBRELVY ) 100 MG TABS Take 1 tablet (100 mg total) by mouth as needed. May repeat in 2 hours.  Maximum 2 tablets in 24 hours. 10 tablet 5   No current facility-administered medications on file prior to visit.    ALLERGIES: No Known Allergies  FAMILY HISTORY: Family History  Problem Relation Age of Onset   Hypertension Father       Objective:  *** General: No acute distress.  Patient appears well-groomed.   ***   Juliene Dunnings, DO  CC: Mardy Kiss, PA-C

## 2024-01-31 NOTE — Telephone Encounter (Signed)
 Pharmacy Patient Advocate Encounter  Received notification from [employer] that Prior Authorization for UBRELVY  100MG  has been APPROVED from 9.29.25 to 9.29.27. Ran test claim, Copay is $0. This test claim was processed through [facility]- copay amounts may vary at other pharmacies due to pharmacy/plan contracts, or as the patient moves through the different stages of their insurance plan.   PA #/Case ID/Reference #: [id number]

## 2024-02-01 ENCOUNTER — Ambulatory Visit: Admitting: Neurology

## 2024-02-01 ENCOUNTER — Encounter: Payer: Self-pay | Admitting: Neurology

## 2024-02-01 VITALS — BP 119/84 | HR 82 | Ht 63.0 in | Wt 264.0 lb

## 2024-02-01 DIAGNOSIS — G43009 Migraine without aura, not intractable, without status migrainosus: Secondary | ICD-10-CM

## 2024-02-01 DIAGNOSIS — G8929 Other chronic pain: Secondary | ICD-10-CM

## 2024-02-01 DIAGNOSIS — M549 Dorsalgia, unspecified: Secondary | ICD-10-CM

## 2024-03-06 ENCOUNTER — Ambulatory Visit: Admitting: Neurology

## 2024-04-13 ENCOUNTER — Other Ambulatory Visit: Payer: Self-pay | Admitting: Neurology

## 2024-08-23 ENCOUNTER — Ambulatory Visit: Admitting: Neurology
